# Patient Record
Sex: Female | Born: 1955 | Race: Black or African American | Hispanic: No | Smoking: Never smoker
Health system: Southern US, Community
[De-identification: ages and names within clinical notes are randomized; demographics above are authoritative.]

## PROBLEM LIST (undated history)

## (undated) DIAGNOSIS — J301 Allergic rhinitis due to pollen: Secondary | ICD-10-CM

## (undated) DIAGNOSIS — I10 Essential (primary) hypertension: Secondary | ICD-10-CM

## (undated) HISTORY — DX: Essential (primary) hypertension: I10

---

## 1998-10-12 ENCOUNTER — Other Ambulatory Visit: Admission: RE | Admit: 1998-10-12 | Discharge: 1998-10-12 | Payer: Self-pay | Admitting: Family Medicine

## 1999-05-24 HISTORY — PX: ROTATOR CUFF REPAIR: SHX139

## 2000-06-20 ENCOUNTER — Ambulatory Visit (HOSPITAL_COMMUNITY): Admission: RE | Admit: 2000-06-20 | Discharge: 2000-06-20 | Payer: Self-pay | Admitting: Family Medicine

## 2000-06-20 ENCOUNTER — Encounter: Payer: Self-pay | Admitting: Family Medicine

## 2000-09-01 ENCOUNTER — Other Ambulatory Visit: Admission: RE | Admit: 2000-09-01 | Discharge: 2000-09-01 | Payer: Self-pay | Admitting: Orthopedic Surgery

## 2001-09-12 ENCOUNTER — Ambulatory Visit (HOSPITAL_COMMUNITY): Admission: RE | Admit: 2001-09-12 | Discharge: 2001-09-12 | Payer: Self-pay | Admitting: Orthopedic Surgery

## 2001-09-12 ENCOUNTER — Encounter: Payer: Self-pay | Admitting: Orthopedic Surgery

## 2001-10-23 ENCOUNTER — Other Ambulatory Visit: Admission: RE | Admit: 2001-10-23 | Discharge: 2001-10-23 | Payer: Self-pay | Admitting: Family Medicine

## 2004-03-15 ENCOUNTER — Other Ambulatory Visit: Admission: RE | Admit: 2004-03-15 | Discharge: 2004-03-15 | Payer: Self-pay | Admitting: Family Medicine

## 2006-05-31 ENCOUNTER — Ambulatory Visit (HOSPITAL_COMMUNITY): Admission: RE | Admit: 2006-05-31 | Discharge: 2006-05-31 | Payer: Self-pay | Admitting: Family Medicine

## 2006-06-19 ENCOUNTER — Other Ambulatory Visit: Admission: RE | Admit: 2006-06-19 | Discharge: 2006-06-19 | Payer: Self-pay | Admitting: Family Medicine

## 2007-10-24 ENCOUNTER — Ambulatory Visit (HOSPITAL_COMMUNITY): Admission: RE | Admit: 2007-10-24 | Discharge: 2007-10-24 | Payer: Self-pay | Admitting: Family Medicine

## 2007-11-02 ENCOUNTER — Encounter: Admission: RE | Admit: 2007-11-02 | Discharge: 2007-11-02 | Payer: Self-pay | Admitting: Family Medicine

## 2007-11-22 ENCOUNTER — Other Ambulatory Visit: Admission: RE | Admit: 2007-11-22 | Discharge: 2007-11-22 | Payer: Self-pay | Admitting: Family Medicine

## 2010-01-18 ENCOUNTER — Encounter: Admission: RE | Admit: 2010-01-18 | Discharge: 2010-01-18 | Payer: Self-pay | Admitting: Family Medicine

## 2010-06-13 ENCOUNTER — Encounter: Payer: Self-pay | Admitting: Family Medicine

## 2012-10-29 ENCOUNTER — Encounter (HOSPITAL_COMMUNITY): Payer: Self-pay | Admitting: *Deleted

## 2012-10-29 ENCOUNTER — Emergency Department (HOSPITAL_COMMUNITY)
Admission: EM | Admit: 2012-10-29 | Discharge: 2012-10-29 | Disposition: A | Discharge status: Home / Self Care | Payer: BC Managed Care – PPO | Source: Home / Self Care | Attending: Emergency Medicine | Admitting: Emergency Medicine

## 2012-10-29 DIAGNOSIS — J029 Acute pharyngitis, unspecified: Secondary | ICD-10-CM

## 2012-10-29 DIAGNOSIS — R05 Cough: Secondary | ICD-10-CM

## 2012-10-29 DIAGNOSIS — R059 Cough, unspecified: Secondary | ICD-10-CM

## 2012-10-29 HISTORY — DX: Allergic rhinitis due to pollen: J30.1

## 2012-10-29 MED ORDER — PSEUDOEPH-BROMPHEN-DM 30-2-10 MG/5ML PO SYRP: 5.0000 mL | ORAL_SOLUTION | Freq: Four times a day (QID) | ORAL | Status: DC | PRN

## 2012-10-29 NOTE — ED Notes (Signed)
C/o sore throat onset Friday that has gotten progressively. Hx. Of seasonal allergies.  Voice sounds hoarse. Has occ. cough and mucous is clear from her throat. C/o stuffy nose.  No chills or fever.

## 2012-10-29 NOTE — ED Provider Notes (Signed)
Medical screening examination/treatment/procedure(s) were performed by non-physician practitioner and as supervising physician I was immediately available for consultation/collaboration.  Tanda Morrissey   Abdirizak Richison, MD 10/29/12 1949 

## 2012-10-29 NOTE — ED Provider Notes (Signed)
History     CSN: [id number]  Arrival date & time 10/29/12  1722   First MD Initiated Contact with Patient 10/29/12 1852      Chief Complaint  Patient presents with  . Sore Throat    (Consider location/radiation/quality/duration/timing/severity/associated sxs/prior treatment) HPI Comments: Pt presents c/o sore throat for 3 days, upset stomach, and feeling generally unwell.  She states this may be her allergies but she has had allergies for years and it never makes her throat hurt like this.  She has also had a mild, dry, non-productive cough.  She denies any measured fever, chills, vomiting, diarrhea, rash, sick contacts.    Patient is a 57 y.o. female presenting with pharyngitis.  Sore Throat Pertinent negatives include no chest pain, no abdominal pain and no shortness of breath.    Past Medical History  Diagnosis Date  . Hayfever     Past Surgical History  Procedure Laterality Date  . Rotator cuff repair Left 2001    Family History  Problem Relation Age of Onset  . Hypertension Mother   . Thyroid disease Mother   . Hypertension Father     History  Substance Use Topics  . Smoking status: Never Smoker   . Smokeless tobacco: Not on file  . Alcohol Use: No    OB History   Grav Para Term Preterm Abortions TAB SAB Ect Mult Living                  Review of Systems  Constitutional: Negative for fever and chills.  HENT: Positive for sore throat. Negative for congestion, rhinorrhea, sneezing, mouth sores, trouble swallowing, voice change, postnasal drip and sinus pressure.   Eyes: Negative for visual disturbance.  Respiratory: Positive for cough. Negative for shortness of breath.   Cardiovascular: Negative for chest pain, palpitations and leg swelling.  Gastrointestinal: Negative for nausea, vomiting and abdominal pain.  Endocrine: Negative for polydipsia and polyuria.  Genitourinary: Negative for dysuria, urgency and frequency.  Musculoskeletal: Negative for  myalgias and arthralgias.  Skin: Negative for rash.  Neurological: Negative for dizziness, weakness and light-headedness.    Allergies  Review of patient's allergies indicates not on file.  Home Medications   Current Outpatient Rx  Name  Route  Sig  Dispense  Refill  . cetirizine (ZYRTEC) 10 MG tablet   Oral   Take 10 mg by mouth daily.         . brompheniramine-pseudoephedrine-DM 30-2-10 MG/5ML syrup   Oral   Take 5 mLs by mouth 4 (four) times daily as needed.   120 mL   1     BP 140/87  Pulse 76  Temp(Src) 98.1 F (36.7 C) (Oral)  Resp 16  SpO2 100%  Physical Exam  Nursing note and vitals reviewed. Constitutional: She is oriented to person, place, and time. Vital signs are normal. She appears well-developed and well-nourished. No distress.  HENT:  Head: Atraumatic.  Mouth/Throat: Posterior oropharyngeal erythema present. No oropharyngeal exudate.  Eyes: EOM are normal. Pupils are equal, round, and reactive to light.  Cardiovascular: Normal rate, regular rhythm and normal heart sounds.  Exam reveals no gallop and no friction rub.   No murmur heard. Pulmonary/Chest: Effort normal and breath sounds normal. No respiratory distress. She has no wheezes. She has no rales.  Abdominal: Soft. There is no tenderness.  Lymphadenopathy:       Head (right side): Submandibular and tonsillar adenopathy present.       Head (left side): Submandibular and  tonsillar adenopathy present.    She has no cervical adenopathy.  Neurological: She is alert and oriented to person, place, and time. She has normal strength.  Skin: Skin is warm and dry. She is not diaphoretic.  Psychiatric: She has a normal mood and affect. Her behavior is normal. Judgment normal.    ED Course  Procedures (including critical care time)  Labs Reviewed  POCT RAPID STREP A ([facility] ONLY)   No results found.   1. Viral pharyngitis   2. Cough       MDM  Treat symptomatically.  F/u in 1 week if  not improving    Meds ordered this encounter  Medications  .       . brompheniramine-pseudoephedrine-DM 30-2-10 MG/5ML syrup    Sig: Take 5 mLs by mouth 4 (four) times daily as needed.    Dispense:  120 mL    Refill:  1           Graylon Good, PA-C 10/29/12 1925

## 2012-10-31 LAB — CULTURE, GROUP A STREP

## 2013-08-16 ENCOUNTER — Other Ambulatory Visit (HOSPITAL_COMMUNITY)
Admission: RE | Admit: 2013-08-16 | Discharge: 2013-08-16 | Disposition: A | Discharge status: Home / Self Care | Payer: BC Managed Care – PPO | Source: Ambulatory Visit | Attending: Family Medicine | Admitting: Family Medicine

## 2013-08-16 DIAGNOSIS — Z113 Encounter for screening for infections with a predominantly sexual mode of transmission: Secondary | ICD-10-CM | POA: Insufficient documentation

## 2013-08-16 DIAGNOSIS — N760 Acute vaginitis: Secondary | ICD-10-CM | POA: Insufficient documentation

## 2013-11-14 ENCOUNTER — Other Ambulatory Visit (HOSPITAL_COMMUNITY)
Admission: RE | Admit: 2013-11-14 | Discharge: 2013-11-14 | Disposition: A | Discharge status: Home / Self Care | Payer: BC Managed Care – PPO | Source: Ambulatory Visit | Attending: Family Medicine | Admitting: Family Medicine

## 2013-11-14 DIAGNOSIS — Z1151 Encounter for screening for human papillomavirus (HPV): Secondary | ICD-10-CM | POA: Insufficient documentation

## 2013-11-14 DIAGNOSIS — Z01419 Encounter for gynecological examination (general) (routine) without abnormal findings: Secondary | ICD-10-CM | POA: Insufficient documentation

## 2014-07-30 ENCOUNTER — Encounter (HOSPITAL_COMMUNITY): Payer: Self-pay | Admitting: Emergency Medicine

## 2014-07-30 ENCOUNTER — Emergency Department (HOSPITAL_COMMUNITY)
Admission: EM | Admit: 2014-07-30 | Discharge: 2014-07-30 | Disposition: A | Discharge status: Home / Self Care | Payer: BC Managed Care – PPO | Source: Home / Self Care | Attending: Family Medicine | Admitting: Family Medicine

## 2014-07-30 ENCOUNTER — Emergency Department (INDEPENDENT_AMBULATORY_CARE_PROVIDER_SITE_OTHER): Payer: BC Managed Care – PPO

## 2014-07-30 DIAGNOSIS — M25552 Pain in left hip: Secondary | ICD-10-CM

## 2014-07-30 DIAGNOSIS — R52 Pain, unspecified: Secondary | ICD-10-CM

## 2014-07-30 MED ORDER — HYDROCODONE-ACETAMINOPHEN 5-325 MG PO TABS: 2.0000 | ORAL_TABLET | ORAL | Status: DC | PRN

## 2014-07-30 MED ORDER — IBUPROFEN 800 MG PO TABS: 800.0000 mg | ORAL_TABLET | Freq: Three times a day (TID) | ORAL | Status: DC

## 2014-07-30 NOTE — ED Provider Notes (Signed)
CSN: [id number]     Arrival date & time 07/30/14  1421 History   First MD Initiated Contact with Patient 07/30/14 1541     Chief Complaint  Patient presents with  . Leg Pain   (Consider location/radiation/quality/duration/timing/severity/associated sxs/prior Treatment) Patient is a 59 y.o. female presenting with leg pain. The history is provided by the patient. No language interpreter was used.  Leg Pain Location:  Hip Injury: no   Hip location:  L hip Pain details:    Quality:  Aching   Radiates to:  Does not radiate   Severity:  Moderate   Onset quality:  Gradual   Timing:  Constant   Progression:  Worsening Chronicity:  New Dislocation: no   Foreign body present:  No foreign bodies Relieved by:  Nothing Worsened by:  Nothing tried Ineffective treatments:  Acetaminophen Risk factors: no recent illness   Pt had pain in both thighs and trouble walking 3 weeks ago.  Pt reports pain in left groin and hip area now.  Pt reports she was recently treated with crestor for elevated cholesterol.  Past Medical History  Diagnosis Date  . Hayfever    Past Surgical History  Procedure Laterality Date  . Rotator cuff repair Left 2001   Family History  Problem Relation Age of Onset  . Hypertension Mother   . Thyroid disease Mother   . Hypertension Father    History  Substance Use Topics  . Smoking status: Never Smoker   . Smokeless tobacco: Not on file  . Alcohol Use: No   OB History    No data available     Review of Systems  Musculoskeletal: Positive for myalgias and joint swelling.  All other systems reviewed and are negative.   Allergies  Review of patient's allergies indicates no known allergies.  Home Medications   Prior to Admission medications   Medication Sig Start Date End Date Taking? Authorizing Provider  brompheniramine-pseudoephedrine-DM 30-2-10 MG/5ML syrup Take 5 mLs by mouth 4 (four) times daily as needed. 10/29/12   Graylon GoodZachary H Baker, PA-C  cetirizine  (ZYRTEC) 10 MG tablet Take 10 mg by mouth daily.    Historical Provider, MD   BP 126/78 mmHg  Pulse 67  Temp(Src) 97.8 F (36.6 C) (Oral)  Resp 16  SpO2 98% Physical Exam  Constitutional: She is oriented to person, place, and time. She appears well-developed and well-nourished.  Musculoskeletal: She exhibits tenderness.  Tender left hip and groin  Pain with figure 4.   Neurological: She is alert and oriented to person, place, and time. She has normal reflexes.  Skin: Skin is warm.  Psychiatric: She has a normal mood and affect.  Nursing note and vitals reviewed.   ED Course  Procedures (including critical care time) Labs Review Labs Reviewed - No data to display  Imaging Review Dg Hip Unilat With Pelvis 2-3 Views Left  07/30/2014   CLINICAL DATA:  Left hip pain, 2 weeks duration  EXAM: LEFT HIP (WITH PELVIS) 2-3 VIEWS  COMPARISON:  None.  FINDINGS: There is mild joint space narrowing laterally. No fracture. No avascular necrosis. No other focal finding. Bones of the pelvis appear normal. Sacroiliac joints are normal.  IMPRESSION: Mild joint space narrowing laterally.  Otherwise normal.   Electronically Signed   By: Paulina FusiMark  Shogry M.D.   On: 07/30/2014 16:20     MDM   1. Pain    See your Physicain for recheck next week.   Stop crestor Ibuprofen 600mg .  Hydrocodone  Lonia Skinner [place], PA-C 07/30/14 1747

## 2014-07-30 NOTE — Discharge Instructions (Signed)

## 2014-07-30 NOTE — ED Notes (Signed)
Previous   Entry  By  This  Clinical research associateWriter  Entered     In  error

## 2014-07-30 NOTE — ED Notes (Signed)
C/o intermittent left leg pain onset 1-2 weeks Today, pain has been more constant; 6/10 Denies inj/trauma Alert, no signs of acute distress.

## 2016-03-15 ENCOUNTER — Other Ambulatory Visit: Payer: Self-pay | Admitting: Family Medicine

## 2016-03-15 ENCOUNTER — Other Ambulatory Visit (HOSPITAL_COMMUNITY)
Admission: RE | Admit: 2016-03-15 | Discharge: 2016-03-15 | Disposition: A | Discharge status: Home / Self Care | Payer: BC Managed Care – PPO | Source: Ambulatory Visit | Attending: Family Medicine | Admitting: Family Medicine

## 2016-03-15 DIAGNOSIS — Z113 Encounter for screening for infections with a predominantly sexual mode of transmission: Secondary | ICD-10-CM | POA: Insufficient documentation

## 2016-03-15 DIAGNOSIS — Z1151 Encounter for screening for human papillomavirus (HPV): Secondary | ICD-10-CM | POA: Insufficient documentation

## 2016-03-15 DIAGNOSIS — Z01419 Encounter for gynecological examination (general) (routine) without abnormal findings: Secondary | ICD-10-CM | POA: Diagnosis present

## 2016-03-18 LAB — CYTOLOGY - PAP
DIAGNOSIS: NEGATIVE
HPV (WINDOPATH): NOT DETECTED

## 2016-03-21 LAB — CERVICOVAGINAL ANCILLARY ONLY
BACTERIAL VAGINITIS: NEGATIVE
CANDIDA VAGINITIS: NEGATIVE
CHLAMYDIA, DNA PROBE: NEGATIVE
NEISSERIA GONORRHEA: NEGATIVE
TRICH (WINDOWPATH): NEGATIVE

## 2017-12-21 ENCOUNTER — Ambulatory Visit
Admission: RE | Admit: 2017-12-21 | Discharge: 2017-12-21 | Disposition: A | Discharge status: Home / Self Care | Payer: BC Managed Care – PPO | Source: Ambulatory Visit | Attending: Family Medicine | Admitting: Family Medicine

## 2017-12-21 ENCOUNTER — Other Ambulatory Visit: Payer: Self-pay | Admitting: Family Medicine

## 2017-12-21 DIAGNOSIS — M130 Polyarthritis, unspecified: Secondary | ICD-10-CM

## 2017-12-21 DIAGNOSIS — M2011 Hallux valgus (acquired), right foot: Secondary | ICD-10-CM

## 2017-12-21 DIAGNOSIS — M2012 Hallux valgus (acquired), left foot: Secondary | ICD-10-CM

## 2018-09-03 ENCOUNTER — Ambulatory Visit
Admission: RE | Admit: 2018-09-03 | Discharge: 2018-09-03 | Disposition: A | Discharge status: Home / Self Care | Payer: BC Managed Care – PPO | Source: Ambulatory Visit | Attending: Family Medicine | Admitting: Family Medicine

## 2018-09-03 ENCOUNTER — Other Ambulatory Visit: Payer: Self-pay

## 2018-09-03 ENCOUNTER — Other Ambulatory Visit: Payer: Self-pay | Admitting: Family Medicine

## 2018-09-03 DIAGNOSIS — J320 Chronic maxillary sinusitis: Secondary | ICD-10-CM

## 2018-09-03 DIAGNOSIS — J301 Allergic rhinitis due to pollen: Secondary | ICD-10-CM

## 2018-09-03 DIAGNOSIS — G4489 Other headache syndrome: Secondary | ICD-10-CM

## 2018-10-01 ENCOUNTER — Other Ambulatory Visit: Payer: Self-pay

## 2018-10-01 ENCOUNTER — Ambulatory Visit
Admission: RE | Admit: 2018-10-01 | Discharge: 2018-10-01 | Disposition: A | Discharge status: Home / Self Care | Payer: BC Managed Care – PPO | Source: Ambulatory Visit | Attending: Family Medicine | Admitting: Family Medicine

## 2019-07-31 ENCOUNTER — Other Ambulatory Visit: Payer: Self-pay | Admitting: *Deleted

## 2019-07-31 ENCOUNTER — Telehealth: Payer: Self-pay | Admitting: *Deleted

## 2019-07-31 DIAGNOSIS — R000 Tachycardia, unspecified: Secondary | ICD-10-CM

## 2019-07-31 NOTE — Telephone Encounter (Signed)
Patient enrolled for [employer] to ship a 30 day cardiac event monitor to her home. 

## 2019-08-08 ENCOUNTER — Ambulatory Visit (INDEPENDENT_AMBULATORY_CARE_PROVIDER_SITE_OTHER): Payer: BC Managed Care – PPO

## 2019-08-08 DIAGNOSIS — R000 Tachycardia, unspecified: Secondary | ICD-10-CM

## 2019-08-31 ENCOUNTER — Ambulatory Visit: Payer: BC Managed Care – PPO | Attending: Internal Medicine

## 2019-08-31 DIAGNOSIS — Z23 Encounter for immunization: Secondary | ICD-10-CM

## 2019-08-31 NOTE — Progress Notes (Signed)
   Covid-19 Vaccination Clinic  Name:  Rebecca Livingston    MRN: [id number] DOB: 14-Aug-1955  08/31/2019  Ms. Rebecca Livingston was observed post Covid-19 immunization for 15 minutes without incident. She was provided with Vaccine Information Sheet and instruction to access the V-Safe system.   Ms. Rebecca Livingston was instructed to call 911 with any severe reactions post vaccine: Marland Kitchen Difficulty breathing  . Swelling of face and throat  . A fast heartbeat  . A bad rash all over body  . Dizziness and weakness   Immunizations Administered    Name Date Dose VIS Date Route   Moderna COVID-19 Vaccine 08/31/2019 10:22 AM 0.5 mL 04/23/2019 Intramuscular   Manufacturer: Moderna   Lot: [id number]   NDC: 47096-283-66

## 2019-09-20 NOTE — Progress Notes (Signed)
Primary Physician/Referring:  Lucianne Lei, MD  Patient ID: Rebecca Livingston, female    DOB: August 29, 1955, 64 y.o.   MRN: [id number]  Chief Complaint  Patient presents with  . Palpitations   HPI:    Rebecca Livingston  is a 64 y.o. African-American female who I had initially seen in 2016 for episodes of rapid heartbeat suggestive of SVT, event monitor and stress testing at that time and echocardiogram are fairly within normal limits.  She has past medical history significant for hyperlipidemia and uses metoprolol on a as needed basis.   She has SVT episodes (symptoms very typical ans suggests SVT) rarely. Last episode was in July 2019, May 2019 and latest 07/24/19. She wore an event monitor for about a month and she did not have any episodes during wearing the monitor.  She now wanted to check in with me for further management.  She remains asymptomatic. Episodes last < 10 minutes. Then she feels fatigued for several hours.   Past Medical History:  Diagnosis Date  . Hayfever    Past Surgical History:  Procedure Laterality Date  . ROTATOR CUFF REPAIR Left 2001   Family History  Problem Relation Age of Onset  . Hypertension Mother   . Thyroid disease Mother   . Hypertension Father   . Asthma Brother   . Hypertension Brother   . Hypertension Sister     Social History   Tobacco Use  . Smoking status: Never Smoker  . Smokeless tobacco: Never Used  Substance Use Topics  . Alcohol use: No   Marital Status: Divorced  ROS  Review of Systems  Cardiovascular: Negative for chest pain, dyspnea on exertion and leg swelling.  Gastrointestinal: Negative for melena.   Objective  Blood pressure 129/86, pulse 78, resp. rate 16, height '5\' 6"'  (1.676 m), weight 169 lb 3.2 oz (76.7 kg), SpO2 98 %.  Vitals with BMI 09/23/2019 07/30/2014 10/29/2012  Height '5\' 6"'  - -  Weight 169 lbs 3 oz - -  BMI 93.26 - -  Systolic [phone]  Diastolic 86 78 87  Pulse 78 67 76     Physical Exam    Cardiovascular: Normal rate, regular rhythm, normal heart sounds and intact distal pulses. Exam reveals no gallop.  No murmur heard. No leg edema, no JVD.  Pulmonary/Chest: Effort normal and breath sounds normal.  Abdominal: Soft. Bowel sounds are normal.   Laboratory examination:   No results for input(s): NA, K, CL, CO2, GLUCOSE, BUN, CREATININE, CALCIUM, GFRNONAA, GFRAA in the last 8760 hours. CrCl cannot be calculated (No successful lab value found.).  No flowsheet data found. No flowsheet data found. Lipid Panel  No results found for: CHOL, TRIG, HDL, CHOLHDL, VLDL, LDLCALC, LDLDIRECT HEMOGLOBIN A1C No results found for: HGBA1C, MPG TSH No results for input(s): TSH in the last 8760 hours.  External labs:   Cholesterol, total 211.000 m 07/26/2019 HDL 71.000 mg 07/26/2019 LDL 115.000 m 07/26/2019 Triglycerides 135.000 m 07/26/2019  A1C 5.900 % of 07/31/2018 TSH 0.280 07/26/2019  Hemoglobin 12.900 g/ 07/26/2019 PLT 272.000  07/26/2019  Creatinine, Serum N/D Potassium 4.100 mm 07/26/2019 Magnesium N/D ALT (SGPT) 11.000 U/L 07/26/2019   Medications and allergies  No Known Allergies   Current Outpatient Medications  Medication Instructions  . cetirizine (ZYRTEC) 10 mg, Daily  . rosuvastatin (CRESTOR) 20 mg, Oral, Daily   Radiology:   No results found.  Cardiac Studies:   Event Monitor 30 days Aug 08, 2016: 09/12/2019 for 30 days:  No symptoms  reported. Sinus rhythm No atrial fibrillation events No AV block or pauses Rare nonsustained ventricular tachycardia.(personally reviewed -  AT with aberancy 4 beats  No sustained arrhythmias Baseline artifact limits interpretation  Treadmill exercise stress test 08/27/2016: Indication: Palpitations, CP Resting EKG demonstrates NSR. The patient exercised according to Bruce Protocol, Total time recorded 7:29 min achieving max heart rate of 154 which was 96% of THR for age and 9.34 METS of work. Stress terminated due to fatigue &  THR (>85% MPHR)/MPHR met. Normal BP response. There was no ST-T changes of ischemia with exercise stress test. There were no significant arrhythmias. Normal BP response. Rec: No e/o ischemia by GXT. Exercise tolerence is low normal. Continue Preventive therapy.  Echocardiogram 08/10/2016: Left ventricle cavity is normal in size. Normal global wall motion. Normal diastolic filling pattern, normal LAP. Calculated EF 68%. Trace mitral regurgitation. Trace tricuspid regurgitation. No evidence of pulmonary hypertension. Trace pulmonic regurgitation. Essentially normal echocardiogram.  EKG  EKG 09/23/2019: Normal sinus rhythm with rate of 70 bpm, left atrial enlargement, normal axis.  No evidence of ischemia.  Assessment     ICD-10-CM   1. PSVT (paroxysmal supraventricular tachycardia) (HCC)  I47.1 EKG 12-Lead    No orders of the defined types were placed in this encounter.   Medications Discontinued During This Encounter  Medication Reason  . brompheniramine-pseudoephedrine-DM 30-2-10 MG/5ML syrup No longer needed (for PRN medications)  . HYDROcodone-acetaminophen (NORCO/VICODIN) 5-325 MG per tablet Patient Preference  . ibuprofen (ADVIL,MOTRIN) 800 MG tablet Patient Preference    Recommendations:   Rebecca Livingston  is a 64 y.o. African-American female who I had initially seen in 2016 for episodes of rapid heartbeat suggestive of SVT, event monitor and stress testing at that time and echocardiogram are fairly within normal limits.  She has past medical history significant for hyperlipidemia.  She has SVT episodes (symptoms very typical ans suggests SVT) rarely. Last episode was in July 2019, May 2019 and latest 07/24/19.  Episodes last < 10 minutes. Then she feels fatigued for several hours.   As her symptoms are extremely rare and only last a few minutes, I have reassured her, and that the symptoms become frequent or sustained would not recommend any further evaluation. Valsalva  strain: I have explained to the patient the mechanism of PSVT, explained Valsalva maneuver, straining, splashing cold water on the face.    Patient also advised to go to the nearest medical facility to obtain a EKG to confirm presence of PSVT or activate EMS.  Also discussed if the usual maneuvers do not improve the symptoms to go to the emergency department.  I will see her back in 1 year for follow-up.  I personally reviewed her event monitor that she wore it last month, no significant arrhythmias were noted.  1 episode of 4 beat NSVT was really an ectopic atrial rhythm with aberrancy.  Lipids are being managed by PCP and controlled. Reviewed her external labs.   Adrian Prows, MD, [facility] 09/23/2019, 10:47 AM [facility]. [place] Pager: [phone] Office: [phone]

## 2019-09-23 ENCOUNTER — Encounter: Payer: Self-pay | Admitting: Cardiology

## 2019-09-23 ENCOUNTER — Ambulatory Visit: Payer: BC Managed Care – PPO | Admitting: Cardiology

## 2019-09-23 ENCOUNTER — Other Ambulatory Visit: Payer: Self-pay

## 2019-09-23 VITALS — BP 120/70 | HR 78 | Resp 16 | Ht 66.0 in | Wt 169.2 lb

## 2019-09-23 DIAGNOSIS — E7800 Pure hypercholesterolemia, unspecified: Secondary | ICD-10-CM

## 2019-09-23 DIAGNOSIS — I471 Supraventricular tachycardia: Secondary | ICD-10-CM

## 2019-09-28 ENCOUNTER — Ambulatory Visit: Payer: BC Managed Care – PPO | Attending: Internal Medicine

## 2019-09-28 DIAGNOSIS — Z23 Encounter for immunization: Secondary | ICD-10-CM

## 2019-09-28 NOTE — Progress Notes (Signed)
   Covid-19 Vaccination Clinic  Name:  Rebecca Livingston    MRN: [id number] DOB: Feb 24, 1956  09/28/2019  Rebecca Livingston was observed post Covid-19 immunization for 15 minutes without incident. She was provided with Vaccine Information Sheet and instruction to access the V-Safe system.   Rebecca Livingston was instructed to call 911 with any severe reactions post vaccine: Marland Kitchen Difficulty breathing  . Swelling of face and throat  . A fast heartbeat  . A bad rash all over body  . Dizziness and weakness   Immunizations Administered    Name Date Dose VIS Date Route   Moderna COVID-19 Vaccine 09/28/2019 10:02 AM 0.5 mL 04/2019 Intramuscular   Manufacturer: Moderna   Lot: [id number]   NDC: 36629-476-54

## 2020-04-15 ENCOUNTER — Other Ambulatory Visit: Payer: Self-pay

## 2020-04-15 ENCOUNTER — Emergency Department (HOSPITAL_COMMUNITY)
Admission: EM | Admit: 2020-04-15 | Discharge: 2020-04-15 | Disposition: A | Discharge status: Home / Self Care | Payer: BC Managed Care – PPO | Attending: Emergency Medicine | Admitting: Emergency Medicine

## 2020-04-15 DIAGNOSIS — Z7982 Long term (current) use of aspirin: Secondary | ICD-10-CM | POA: Diagnosis not present

## 2020-04-15 DIAGNOSIS — R002 Palpitations: Secondary | ICD-10-CM | POA: Diagnosis present

## 2020-04-15 DIAGNOSIS — I471 Supraventricular tachycardia: Secondary | ICD-10-CM

## 2020-04-15 LAB — CBC WITH DIFFERENTIAL/PLATELET
Abs Immature Granulocytes: 0.04 10*3/uL (ref 0.00–0.07)
Basophils Absolute: 0.1 10*3/uL (ref 0.0–0.1)
Basophils Relative: 1 %
Eosinophils Absolute: 0 10*3/uL (ref 0.0–0.5)
Eosinophils Relative: 0 %
HCT: 42.8 % (ref 36.0–46.0)
Hemoglobin: 13.3 g/dL (ref 12.0–15.0)
Immature Granulocytes: 0 %
Lymphocytes Relative: 19 %
Lymphs Abs: 1.7 10*3/uL (ref 0.7–4.0)
MCH: 28.3 pg (ref 26.0–34.0)
MCHC: 31.1 g/dL (ref 30.0–36.0)
MCV: 91.1 fL (ref 80.0–100.0)
Monocytes Absolute: 0.5 10*3/uL (ref 0.1–1.0)
Monocytes Relative: 6 %
Neutro Abs: 6.7 10*3/uL (ref 1.7–7.7)
Neutrophils Relative %: 74 %
Platelets: 281 10*3/uL (ref 150–400)
RBC: 4.7 MIL/uL (ref 3.87–5.11)
RDW: 13 % (ref 11.5–15.5)
WBC: 9.1 10*3/uL (ref 4.0–10.5)
nRBC: 0 % (ref 0.0–0.2)

## 2020-04-15 LAB — BASIC METABOLIC PANEL
Anion gap: 10 (ref 5–15)
BUN: 14 mg/dL (ref 8–23)
CO2: 23 mmol/L (ref 22–32)
Calcium: 8.8 mg/dL — ABNORMAL LOW (ref 8.9–10.3)
Chloride: 108 mmol/L (ref 98–111)
Creatinine, Ser: 0.81 mg/dL (ref 0.44–1.00)
GFR, Estimated: >60 mL/min (ref >60)
Glucose, Bld: 119 mg/dL — ABNORMAL HIGH (ref 70–99)
Potassium: 3.7 mmol/L (ref 3.5–5.1)
Sodium: 141 mmol/L (ref 135–145)

## 2020-04-15 LAB — MAGNESIUM: Magnesium: 2 mg/dL (ref 1.7–2.4)

## 2020-04-15 MED ORDER — METOPROLOL TARTRATE 25 MG PO TABS: 25.0000 mg | ORAL_TABLET | Freq: Every day | ORAL | 0 refills | Status: DC | PRN

## 2020-04-15 MED ORDER — ASPIRIN EC 81 MG PO TBEC: 81.0000 mg | DELAYED_RELEASE_TABLET | Freq: Every day | ORAL | 0 refills | Status: DC

## 2020-04-15 NOTE — ED Notes (Signed)
Pt resting in bed. NADN 

## 2020-04-15 NOTE — Discharge Instructions (Signed)
You're seen in the ER for palpitations. Your heart rate has been in normal range while in the ER.  It appears that you were in a rhythm called supraventricular tachycardia.  Take the medication prescribed if your heart rate is over 120.  Start taking aspirin daily.  We would like you to follow-up with the A. fib clinic to determine further diagnostic work-up and management.

## 2020-04-15 NOTE — ED Provider Notes (Addendum)
[facility] EMERGENCY DEPARTMENT Provider Note   CSN: [id number] Arrival date & time: 04/15/20  7078     History Chief Complaint  Patient presents with  . Palpitations    Rebecca Livingston is a 64 y.o. female.  HPI    86 of female comes in a chief complaint of palpitations. Patient reports that she has no history of A. fib, CAD, cardiac arrhythmia.  Over the last several years, occasionally she will have bouts of arrhythmia.  She is actually seen cardiology and has had Holter monitoring, although work-up has been negative.  Typically the symptoms come once or twice every 6 months.  Yesterday around 1 AM she noted that her heart rate was racing.  This morning she woke up and continued to feel the same way, therefore she called EMS.  Patient has associated symptom of weakness and exertional shortness of breath.  Typically the symptoms would last for few minutes.  In the ER patient is asymptomatic and her heart rate is in the 80s. She went to an urgent care and they advised that she come to the ER because her heart rate was over 120.  No medications were given.  Past Medical History:  Diagnosis Date  . Hayfever     There are no problems to display for this patient.   Past Surgical History:  Procedure Laterality Date  . ROTATOR CUFF REPAIR Left 2001     OB History   No obstetric history on file.     Family History  Problem Relation Age of Onset  . Hypertension Mother   . Thyroid disease Mother   . Hypertension Father   . Asthma Brother   . Hypertension Brother   . Hypertension Sister     Social History   Tobacco Use  . Smoking status: Never Smoker  . Smokeless tobacco: Never Used  Vaping Use  . Vaping Use: Never used  Substance Use Topics  . Alcohol use: No  . Drug use: No    Home Medications Prior to Admission medications   Medication Sig Start Date End Date Taking? Authorizing Provider  cetirizine (ZYRTEC) 10 MG tablet Take 10 mg  by mouth daily.   Yes [provider]  OVER THE COUNTER MEDICATION Take 2 tablets by mouth daily. MDR - Fitness tabs for women   Yes [provider]  rosuvastatin (CRESTOR) 20 MG tablet Take 20 mg by mouth daily. 06/26/19  Yes [provider]  aspirin EC 81 MG tablet Take 1 tablet (81 mg total) by mouth daily. Swallow whole. 04/15/20   Derwood Kaplan, MD  metoprolol tartrate (LOPRESSOR) 25 MG tablet Take 1 tablet (25 mg total) by mouth daily as needed (HR > 120. Do not take the medicine in any event if the SBP < 100). 04/15/20   Derwood Kaplan, MD    Allergies    Patient has no known allergies.  Review of Systems   Review of Systems  Constitutional: Positive for fatigue. Negative for activity change.  Respiratory: Negative for shortness of breath.   Cardiovascular: Negative for chest pain.  Gastrointestinal: Negative for nausea and vomiting.  Musculoskeletal: Negative for arthralgias.  Allergic/Immunologic: Negative for immunocompromised state.  Neurological: Positive for light-headedness.    Physical Exam Updated Vital Signs BP 108/80   Pulse 74   Temp 98.4 F (36.9 C)   Resp (!) 22   Ht 5\' 4"  (1.626 m)   Wt 71.7 kg   SpO2 99%   BMI 27.12 kg/m  Physical Exam Vitals and nursing note reviewed.  Constitutional:      Appearance: She is well-developed.  HENT:     Head: Normocephalic and atraumatic.  Cardiovascular:     Rate and Rhythm: Normal rate.  Pulmonary:     Effort: Pulmonary effort is normal.  Abdominal:     General: Bowel sounds are normal.  Musculoskeletal:     Cervical back: Normal range of motion and neck supple.  Skin:    General: Skin is warm and dry.  Neurological:     Mental Status: She is alert and oriented to person, place, and time.     ED Results / Procedures / Treatments   Labs (all labs ordered are listed, but only abnormal results are displayed) Labs Reviewed  BASIC METABOLIC PANEL - Abnormal; Notable for the  following components:      Result Value   Glucose, Bld 119 (*)    Calcium 8.8 (*)    All other components within normal limits  CBC WITH DIFFERENTIAL/PLATELET  MAGNESIUM    EKG EKG Interpretation  Date/Time:  Wednesday April 15 2020 09:34:50 EST Ventricular Rate:  84 PR Interval:    QRS Duration: 82 QT Interval:  370 QTC Calculation: 438 R Axis:   58 Text Interpretation: Sinus rhythm Atrial premature complex RAE, consider biatrial enlargement RSR' in V1 or V2, probably normal variant No acute changes No old tracing to compare Confirmed by Derwood Kaplan [phone]) on 04/15/2020 9:52:27 AM   Radiology No results found.  Procedures Procedures (including critical care time)      Medications Ordered in ED Medications - No data to display  ED Course  I have reviewed the triage vital signs and the nursing notes.  Pertinent labs & imaging results that were available during my care of the patient were reviewed by me and considered in my medical decision making (see chart for details).    MDM Rules/Calculators/A&P                          Pt comes in with cc of palpitation.  I have reviewed the EKGs from the urgent care and EMS.  Patient has a narrow complex tachycardia, regular rhythm, with heart rate close to 150.  Differential diagnosis is a flutter with 2-1 block, PSVT.  Patient currently in sinus rhythm.  She doesn't have risk factors for A. Fib.  Plan is to have her follow-up with A. fib clinic.  Her [place] vas score is 1.  We will start her on aspirin.  25 mg oral metoprolol will be initiated as a as needed medication for palpitations with heart rate over 120.   1:57 PM    CHA2DS2-VASc Score = 1  This indicates a 0.6% annual risk of stroke. The patient's score is based upon: CHF History: 0 HTN History: 0 Diabetes History: 0 Stroke History: 0 Vascular Disease History: 0 Age Score: 0 Gender Score: 1     1:57 PM The patient appears reasonably screened  and/or stabilized for discharge and I doubt any other medical condition or other [facility] requiring further screening, evaluation, or treatment in the ED at this time prior to discharge.   Results from the ER workup discussed with the patient face to face and all questions answered to the best of my ability. The patient is safe for discharge with strict return precautions.  No arrhythmia noted in the ER.  Final Clinical Impression(s) / ED Diagnoses Final diagnoses:  SVT (  supraventricular tachycardia) (HCC)    Rx / DC Orders ED Discharge Orders         Ordered    aspirin EC 81 MG tablet  Daily        04/15/20 1140    metoprolol tartrate (LOPRESSOR) 25 MG tablet  Daily PRN        04/15/20 1140            Derwood Kaplan, MD 04/15/20 1357

## 2020-04-15 NOTE — ED Triage Notes (Signed)
Pt presents from urgent care w/ c/o palpitation. Discovered to be in SVT @ 150. Enroute, pt received 6mg  Adenosine with successful conversion of rhythm to NSR. On arrival, Pt in NSR denies distress.

## 2020-05-21 ENCOUNTER — Ambulatory Visit: Payer: BC Managed Care – PPO

## 2020-09-23 ENCOUNTER — Ambulatory Visit: Payer: BC Managed Care – PPO | Admitting: Cardiology

## 2020-11-09 ENCOUNTER — Ambulatory Visit: Payer: BC Managed Care – PPO | Admitting: Cardiology

## 2020-12-07 ENCOUNTER — Other Ambulatory Visit: Payer: Self-pay

## 2020-12-07 DIAGNOSIS — R252 Cramp and spasm: Secondary | ICD-10-CM

## 2020-12-15 ENCOUNTER — Encounter: Payer: Self-pay | Admitting: Vascular Surgery

## 2020-12-15 ENCOUNTER — Ambulatory Visit (INDEPENDENT_AMBULATORY_CARE_PROVIDER_SITE_OTHER)
Admission: RE | Admit: 2020-12-15 | Discharge: 2020-12-15 | Disposition: A | Discharge status: Home / Self Care | Payer: BC Managed Care – PPO | Source: Ambulatory Visit | Attending: Vascular Surgery | Admitting: Vascular Surgery

## 2020-12-15 ENCOUNTER — Ambulatory Visit (HOSPITAL_COMMUNITY)
Admission: RE | Admit: 2020-12-15 | Discharge: 2020-12-15 | Disposition: A | Discharge status: Home / Self Care | Payer: BC Managed Care – PPO | Source: Ambulatory Visit | Attending: Vascular Surgery | Admitting: Vascular Surgery

## 2020-12-15 ENCOUNTER — Ambulatory Visit: Payer: BC Managed Care – PPO | Admitting: Vascular Surgery

## 2020-12-15 ENCOUNTER — Other Ambulatory Visit: Payer: Self-pay

## 2020-12-15 VITALS — BP 149/95 | HR 68 | Temp 97.8°F | Resp 20 | Ht 64.0 in | Wt 161.0 lb

## 2020-12-15 DIAGNOSIS — R252 Cramp and spasm: Secondary | ICD-10-CM | POA: Insufficient documentation

## 2020-12-15 NOTE — Progress Notes (Signed)
[facility] [place]  ASSESSMENT / PLAN: 65 y.o. female with bilateral leg cramping of unclear etiology.  Patient reports its somewhat related to dehydration.  I counseled her on supportive measures.  She has no evidence of peripheral arterial disease.  Mild venous reflux identified on today's scan is not the cause of her cramping.  Follow-up with me as needed.  CHIEF COMPLAINT: Leg cramping  HISTORY OF PRESENT ILLNESS: Rebecca Livingston is a 65 y.o. female who presents to clinic for evaluation of painful cramping.  This occurs in the bilateral legs episodically.  Cramping occurs in her thighs and progresses to her calves.  Walking around seems to improve the cramping.  She does not have symptoms classic for intermittent claudication.  She does not have symptoms classic for ischemic rest pain.  She has no ulcers about her feet.  She has no limits to ambulation.  Past Medical History:  Diagnosis Date   Hayfever    Hypertension     Past Surgical History:  Procedure Laterality Date   ROTATOR CUFF REPAIR Left 2001    Family History  Problem Relation Age of Onset   Hypertension Mother    Thyroid disease Mother    Hypertension Father    Asthma Brother    Hypertension Brother    Hypertension Sister     Social History   Socioeconomic History   Marital status: Divorced    Spouse name: Not on file   Number of children: 2   Years of education: Not on file   Highest education level: Not on file  Occupational History   Not on file  Tobacco Use   Smoking status: Never   Smokeless tobacco: Never  Vaping Use   Vaping Use: Never used  Substance and Sexual Activity   Alcohol use: No   Drug use: No   Sexual activity: Yes    Birth control/protection: None  Other Topics Concern   Not on file  Social History Narrative   Not on file   Social Determinants of Health   Financial Resource Strain: Not on file  Food Insecurity: Not on file  Transportation  Needs: Not on file  Physical Activity: Not on file  Stress: Not on file  Social Connections: Not on file  Intimate Partner Violence: Not on file    No Known Allergies  Current Outpatient Medications  Medication Sig Dispense Refill   aspirin EC 81 MG tablet Take 1 tablet (81 mg total) by mouth daily. Swallow whole. 30 tablet 0   cetirizine (ZYRTEC) 10 MG tablet Take 10 mg by mouth daily.     cholecalciferol (VITAMIN D3) 25 MCG (1000 UNIT) tablet Take 1,000 Units by mouth daily.     metoprolol tartrate (LOPRESSOR) 25 MG tablet Take 1 tablet (25 mg total) by mouth daily as needed (HR > 120. Do not take the medicine in any event if the SBP < 100). 15 tablet 0   Omega-3 Fatty Acids (FISH OIL) 1000 MG CAPS Take by mouth.     OVER THE COUNTER MEDICATION Take 2 tablets by mouth daily. MDR - Fitness tabs for women     vitamin C (ASCORBIC ACID) 500 MG tablet Take 500 mg by mouth daily.     rosuvastatin (CRESTOR) 20 MG tablet Take 20 mg by mouth daily. (Patient not taking: Reported on 12/15/2020)     No current facility-administered medications for this visit.    REVIEW OF SYSTEMS:  [X]  denotes positive finding, [ ]  denotes  negative finding Cardiac  Comments:  Chest pain or chest pressure:    Shortness of breath upon exertion:    Short of breath when lying flat:    Irregular heart rhythm:        Vascular    Pain in calf, thigh, or hip brought on by ambulation:    Pain in feet at night that wakes you up from your sleep:     Blood clot in your veins:    Leg swelling:         Pulmonary    Oxygen at home:    Productive cough:     Wheezing:         Neurologic    Sudden weakness in arms or legs:     Sudden numbness in arms or legs:     Sudden onset of difficulty speaking or slurred speech:    Temporary loss of vision in one eye:     Problems with dizziness:         Gastrointestinal    Blood in stool:     Vomited blood:         Genitourinary    Burning when urinating:     Blood in  urine:        Psychiatric    Major depression:         Hematologic    Bleeding problems:    Problems with blood clotting too easily:        Skin    Rashes or ulcers:        Constitutional    Fever or chills:      PHYSICAL EXAM Vitals:   12/15/20 1532  BP: (!) 149/95  Pulse: 68  Resp: 20  Temp: 97.8 F (36.6 C)  SpO2: 100%  Weight: 161 lb (73 kg)  Height: 5\' 4"  (1.626 m)    Constitutional: well appearing. no distress. Appears well nourished.  Neurologic: CN intact. no focal findings. no sensory loss. Psychiatric:  Mood and affect symmetric and appropriate. Eyes:  No icterus. No conjunctival pallor. Ears, nose, throat:  mucous membranes moist. Midline trachea.  Cardiac: regular rate and rhythm.  Respiratory:  unlabored. Abdominal:  soft, non-tender, non-distended.  Peripheral vascular: 2+ DP pulses bilaterally Extremity: no edema. no cyanosis. no pallor.  Skin: no gangrene. no ulceration.  Lymphatic: no Stemmer's sign. no palpable lymphadenopathy.  PERTINENT LABORATORY AND RADIOLOGIC DATA  Most recent CBC CBC Latest Ref Rng & Units 04/15/2020  WBC 4.0 - 10.5 K/uL 9.1  Hemoglobin 12.0 - 15.0 g/dL 04/17/2020  Hematocrit 67.6 - 46.0 % 42.8  Platelets 150 - 400 K/uL 281     Most recent CMP CMP Latest Ref Rng & Units 04/15/2020  Glucose 70 - 99 mg/dL 04/17/2020)  BUN 8 - 23 mg/dL 14  Creatinine 093(O - 6.71 mg/dL 2.45  Sodium 8.09 - 983 mmol/L 141  Potassium 3.5 - 5.1 mmol/L 3.7  Chloride 98 - 111 mmol/L 108  CO2 22 - 32 mmol/L 23  Calcium 8.9 - 10.3 mg/dL 382)   ABI 5.0(N:     Venous reflux study 12/15/20  12/17/20. Rande Brunt, MD [facility] Phone Number: [phone] 12/15/2020 4:35 PM

## 2020-12-16 ENCOUNTER — Ambulatory Visit: Payer: BC Managed Care – PPO | Admitting: Cardiology

## 2020-12-28 ENCOUNTER — Ambulatory Visit: Payer: BC Managed Care – PPO | Admitting: Cardiology

## 2021-01-12 ENCOUNTER — Ambulatory Visit: Payer: BC Managed Care – PPO | Admitting: Cardiology

## 2021-01-27 ENCOUNTER — Ambulatory Visit: Payer: BC Managed Care – PPO | Admitting: Cardiology

## 2021-03-01 ENCOUNTER — Encounter: Payer: Self-pay | Admitting: Cardiology

## 2021-03-01 ENCOUNTER — Other Ambulatory Visit: Payer: Self-pay

## 2021-03-01 ENCOUNTER — Ambulatory Visit: Payer: BC Managed Care – PPO | Admitting: Cardiology

## 2021-03-01 VITALS — BP 157/98 | HR 73 | Temp 97.9°F | Resp 17 | Ht 64.0 in | Wt 167.0 lb

## 2021-03-01 DIAGNOSIS — E7800 Pure hypercholesterolemia, unspecified: Secondary | ICD-10-CM

## 2021-03-01 DIAGNOSIS — R030 Elevated blood-pressure reading, without diagnosis of hypertension: Secondary | ICD-10-CM

## 2021-03-01 DIAGNOSIS — I471 Supraventricular tachycardia: Secondary | ICD-10-CM

## 2021-03-01 NOTE — Progress Notes (Signed)
Primary Physician/Referring:  Lucianne Lei, MD  Patient ID: Rebecca Livingston, female    DOB: [birth year], 65 y.o.   MRN: [id number]  Chief Complaint  Patient presents with   Follow-up    1 year   SVT   HPI:    Rebecca Livingston  is a 65 y.o. African-American female who I had initially seen in 2016 for episodes of rapid heartbeat suggestive of SVT, event monitor and stress testing at that time and echocardiogram are fairly within normal limits.  She has past medical history significant for hyperlipidemia.  She has SVT episodes (symptoms very typical ans suggests SVT) rarely.   Her last episode of sustained rapid heartbeat was in November 2021 and went to the urgent care, was sent to the ED but was back in sinus rhythm.  She has not had any further recurrence.  She is aware of Valsalva like maneuvers.  Episode lasted the entire night.  Past Medical History:  Diagnosis Date   Hayfever    Hypertension    Past Surgical History:  Procedure Laterality Date   ROTATOR CUFF REPAIR Left 2001   Family History  Problem Relation Age of Onset   Hypertension Mother    Thyroid disease Mother    Hypertension Father    Asthma Brother    Hypertension Brother    Hypertension Sister     Social History   Tobacco Use   Smoking status: Never   Smokeless tobacco: Never  Substance Use Topics   Alcohol use: No   Marital Status: Divorced  ROS  Review of Systems  Cardiovascular:  Negative for chest pain, dyspnea on exertion and leg swelling.  Gastrointestinal:  Negative for melena.  Objective  Blood pressure (!) 157/98, pulse 73, temperature 97.9 F (36.6 C), temperature source Temporal, resp. rate 17, height _0  (1.626 m), weight 167 lb (75.8 kg), SpO2 99 %.  Vitals with BMI 03/01/2021 03/01/2021 12/15/2020  Height - _1  _2   Weight - 167 lbs 161 lbs  BMI - 56.31 49.70  Systolic 263 785 885  Diastolic 98 83 95  Pulse 73 80 68     Physical Exam Cardiovascular:     Rate and  Rhythm: Normal rate and regular rhythm.     Pulses: Intact distal pulses.     Heart sounds: Normal heart sounds. No murmur heard.   No gallop.     Comments: No leg edema, no JVD. Pulmonary:     Effort: Pulmonary effort is normal.     Breath sounds: Normal breath sounds.  Abdominal:     General: Bowel sounds are normal.     Palpations: Abdomen is soft.   Laboratory examination:   Recent Labs    04/15/20 1026  NA 141  K 3.7  CL 108  CO2 23  GLUCOSE 119*  BUN 14  CREATININE 0.81  CALCIUM 8.8*  GFRNONAA >60   CrCl cannot be calculated (Patient's most recent lab result is older than the maximum 21 days allowed.).  CMP Latest Ref Rng & Units 04/15/2020  Glucose 70 - 99 mg/dL 119(H)  BUN 8 - 23 mg/dL 14  Creatinine 0.44 - 1.00 mg/dL 0.81  Sodium 135 - 145 mmol/L 141  Potassium 3.5 - 5.1 mmol/L 3.7  Chloride 98 - 111 mmol/L 108  CO2 22 - 32 mmol/L 23  Calcium 8.9 - 10.3 mg/dL 8.8(L)   CBC Latest Ref Rng & Units 04/15/2020  WBC 4.0 - 10.5 K/uL 9.1  Hemoglobin 12.0 - 15.0  g/dL 13.3  Hematocrit 36.0 - 46.0 % 42.8  Platelets 150 - 400 K/uL 281   Lipid Panel  No results found for: CHOL, TRIG, HDL, CHOLHDL, VLDL, LDLCALC, LDLDIRECT  External labs:   Cholesterol, total 302.000 m 02/25/2020 HDL 90.000 mg 02/25/2020 LDL 190.000 m 02/25/2020 Triglycerides 98.000 mg 02/25/2020  A1C 5.700 % of 10/27/2020  Cholesterol, total 211.000 m 07/26/2019 HDL 71.000 mg 07/26/2019 LDL 115.000 m 07/26/2019 Triglycerides 135.000 m 07/26/2019  A1C 5.900 % of 07/31/2018 TSH 0.280 07/26/2019  Hemoglobin 12.900 g/ 07/26/2019 PLT 272.000  07/26/2019  Creatinine, Serum N/D Potassium 4.100 mm 07/26/2019 Magnesium N/D ALT (SGPT) 11.000 U/L 07/26/2019   Medications and allergies  No Known Allergies   Current Outpatient Medications  Medication Instructions   aspirin EC 81 mg, Oral, Daily, Swallow whole.   cetirizine (ZYRTEC) 10 mg, Oral, Daily   cholecalciferol (VITAMIN D3) 1,000 Units, Oral, Daily    metoprolol tartrate (LOPRESSOR) 25 mg, Oral, Daily PRN   Omega-3 Fatty Acids (FISH OIL) 1000 MG CAPS Oral   OVER THE COUNTER MEDICATION 2 tablets, Oral, Daily, MDR - Fitness tabs for women   rosuvastatin (CRESTOR) 20 mg, Oral, Daily   vitamin C (ASCORBIC ACID) 500 mg, Oral, Daily   Radiology:   No results found.  Cardiac Studies:   Event Monitor 30 days 2016-08-31: 09/12/2019 for 30 days:  No symptoms reported. Sinus rhythm No atrial fibrillation events No AV block or pauses Rare nonsustained ventricular tachycardia.(personally reviewed -  AT with aberancy 4 beats  No sustained arrhythmias Baseline artifact limits interpretation  Treadmill exercise stress test 08/27/2016: Indication: Palpitations, CP Resting EKG demonstrates NSR. The patient exercised according to Bruce Protocol, Total time recorded  7:29 min achieving max heart rate of  154  which was  96% of THR for age and  9.34 METS of work.  Stress terminated due to  fatigue & THR (>85% MPHR)/MPHR met. Normal BP response. There was no ST-T changes of ischemia with exercise stress test. There were no significant arrhythmias.  Normal BP response. Rec: No e/o ischemia by GXT. Exercise tolerence is  low normal.  Continue Preventive therapy.   Echocardiogram 08/10/2016: Left ventricle cavity is normal in size. Normal global wall motion. Normal diastolic filling pattern, normal LAP. Calculated EF 68%. Trace mitral regurgitation. Trace tricuspid regurgitation. No evidence of pulmonary hypertension. Trace pulmonic regurgitation. Essentially normal echocardiogram.  EKG  EKG 03/01/2021: Normal sinus rhythm at rate of 84 bpm, normal axis, incomplete right bundle branch block.  Normal EKG.  No significant change from 09/23/2019.  Assessment     ICD-10-CM   1. PSVT (paroxysmal supraventricular tachycardia) (HCC)  I47.1 EKG 12-Lead    2. Pure hypercholesterolemia  E78.00 CT CARDIAC SCORING ([facility] LOCATIONS ONLY)    Lipid Panel With  LDL/HDL Ratio    LDL cholesterol, direct    3. Elevated blood pressure reading in office without diagnosis of hypertension  R03.0       No orders of the defined types were placed in this encounter.   There are no discontinued medications.   Recommendations:   Rebecca Livingston  is a 65 y.o. African-American female who I had initially seen in 2016 for episodes of rapid heartbeat suggestive of SVT, event monitor and stress testing at that time and echocardiogram are fairly within normal limits.  She has past medical history significant for hyperlipidemia.  She has SVT episodes (symptoms very typical ans suggests SVT) rarely.   Her last episode of sustained rapid heartbeat was  in November 2021 and went to the urgent care, was sent to the ED but was back in sinus rhythm.  She has not had any further recurrence.  She is aware of Valsalva like maneuvers.  Episode lasted the entire night.  Will continue observation for now.  Her blood pressure was markedly elevated today, there is no diagnosis of hypertension previously.  We will continue to trend this.  I reviewed her external labs, lipids are markedly elevated.  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  I have discussed with her regarding making changes to her lifestyle specifically her diet.  She was previously on a statin, but she discontinued this due to pain in her left thigh area however she still has pain even after discontinuing the statin hence she is willing to restart this back.  I will obtain a coronary calcium score for further cardiac risk stratification.  I will repeat her lipids in 2 months and I will like to see her back then.   Adrian Prows, MD, [facility] 03/01/2021, 3:19 PM Office: [phone] Fax: [phone] Pager: [phone]

## 2021-03-25 ENCOUNTER — Ambulatory Visit
Admission: RE | Admit: 2021-03-25 | Discharge: 2021-03-25 | Disposition: A | Discharge status: Home / Self Care | Payer: No Typology Code available for payment source | Source: Ambulatory Visit | Attending: Cardiology | Admitting: Cardiology

## 2021-03-25 DIAGNOSIS — E7800 Pure hypercholesterolemia, unspecified: Secondary | ICD-10-CM

## 2021-03-25 NOTE — Progress Notes (Signed)
Coronary calcium score 03/25/2021: LM 0 LAD 19 LCx 0 RCA 0. Total Alliston score is 19, MESA database percentile: 74. Ascending and descending thoracic aorta are normal in size. Indeterminate 3 mm nodule at the right lung base. No follow-up needed if patient is low-risk.

## 2021-07-06 ENCOUNTER — Other Ambulatory Visit: Payer: Self-pay | Admitting: Physician Assistant

## 2021-07-06 DIAGNOSIS — Z13820 Encounter for screening for osteoporosis: Secondary | ICD-10-CM

## 2022-02-16 ENCOUNTER — Ambulatory Visit
Admission: RE | Admit: 2022-02-16 | Discharge: 2022-02-16 | Disposition: A | Discharge status: Home / Self Care | Payer: BC Managed Care – PPO | Source: Ambulatory Visit | Attending: Physician Assistant | Admitting: Physician Assistant

## 2022-02-16 DIAGNOSIS — Z13820 Encounter for screening for osteoporosis: Secondary | ICD-10-CM

## 2022-03-07 ENCOUNTER — Other Ambulatory Visit: Payer: Self-pay | Admitting: Cardiology

## 2022-03-08 LAB — LIPID PANEL WITH LDL/HDL RATIO
Cholesterol, Total: 259 mg/dL — ABNORMAL HIGH (ref 100–199)
HDL: 80 mg/dL (ref >39)
LDL Chol Calc (NIH): 163 mg/dL — ABNORMAL HIGH (ref 0–99)
LDL/HDL Ratio: 2 ratio (ref 0.0–3.2)
Triglycerides: 95 mg/dL (ref 0–149)
VLDL Cholesterol Cal: 16 mg/dL (ref 5–40)

## 2022-03-08 LAB — LDL CHOLESTEROL, DIRECT: LDL Direct: 177 mg/dL — ABNORMAL HIGH (ref 0–99)

## 2022-03-11 ENCOUNTER — Ambulatory Visit: Payer: BC Managed Care – PPO

## 2022-03-11 VITALS — BP 143/88 | HR 84 | Temp 98.0°F | Resp 16 | Ht 64.0 in | Wt 164.0 lb

## 2022-03-11 DIAGNOSIS — R030 Elevated blood-pressure reading, without diagnosis of hypertension: Secondary | ICD-10-CM

## 2022-03-11 DIAGNOSIS — E7800 Pure hypercholesterolemia, unspecified: Secondary | ICD-10-CM

## 2022-03-11 DIAGNOSIS — I471 Supraventricular tachycardia, unspecified: Secondary | ICD-10-CM

## 2022-03-11 NOTE — Progress Notes (Signed)
Primary Physician/Referring:  Lucianne Lei, MD  Patient ID: Rebecca Livingston, female    DOB: 1955/10/30, 66 y.o.   MRN: [id number]  Chief Complaint  Patient presents with   Psvt   Hyperlipidemia   Follow-up    1 year   HPI:    Rebecca Livingston  is a 66 y.o. African-American female who was initially seen in 2016 for episodes of rapid heartbeat suggestive of SVT, event monitor and stress testing at that time and echocardiogram are fairly within normal limits.  She has past medical history significant for hyperlipidemia.   She presents today for follow-up. She was last seen 1 year ago and by Dr. Einar Gip.  At that visit she had elevated blood pressure and markedly elevated LDL.  She was restarted on statin and underwent a CT coronary calcium score coronary calcium score of 19 in the LAD placing her in the 74th percentile for patients of the same age, gender, and ethnicity. She has not been taking rosuvastatin as ordered.  Previously on statin she had lower extremity muscle aches and pains.  She is exercising daily and follows a healthy diet.  She does still have occasional episodes of palpitations not changed or progressively worsen. She denies chest pain, shortness of breath, leg edema, orthopnea, PND, syncope.   Past Medical History:  Diagnosis Date   Hayfever    Hypertension    Past Surgical History:  Procedure Laterality Date   ROTATOR CUFF REPAIR Left 2001   Family History  Problem Relation Age of Onset   Hypertension Mother    Thyroid disease Mother    Hypertension Father    Asthma Brother    Hypertension Brother    Hypertension Sister     Social History   Tobacco Use   Smoking status: Never   Smokeless tobacco: Never  Substance Use Topics   Alcohol use: No   Marital Status: Divorced  ROS  Review of Systems  Cardiovascular:  Positive for palpitations (stable). Negative for chest pain, claudication, dyspnea on exertion and leg swelling.  Gastrointestinal:   Negative for melena.   Objective  Blood pressure (!) 143/88, pulse 84, temperature 98 F (36.7 C), temperature source Temporal, resp. rate 16, height 5' 4" (1.626 m), weight 164 lb (74.4 kg), SpO2 97 %.     03/11/2022    1:07 PM 03/11/2022    1:01 PM 03/01/2021    2:36 PM  Vitals with BMI  Height  5' 4"   Weight  164 lbs   BMI  11.91   Systolic 478 295 621  Diastolic 88 90 98  Pulse 84 85 73     Physical Exam Cardiovascular:     Rate and Rhythm: Normal rate and regular rhythm.     Pulses: Intact distal pulses.     Heart sounds: Normal heart sounds. No murmur heard.    No gallop.     Comments: No leg edema, no JVD. Pulmonary:     Effort: Pulmonary effort is normal.     Breath sounds: Normal breath sounds.  Abdominal:     General: Bowel sounds are normal.     Palpations: Abdomen is soft.  Musculoskeletal:     Right lower leg: No edema.     Left lower leg: No edema.    Laboratory examination:   No results for input(s): "NA", "K", "CL", "CO2", "GLUCOSE", "BUN", "CREATININE", "CALCIUM", "GFRNONAA", "GFRAA" in the last 8760 hours.  CrCl cannot be calculated (Patient's most recent lab result is older  than the maximum 21 days allowed.).     Latest Ref Rng & Units 04/15/2020   10:26 AM  CMP  Glucose 70 - 99 mg/dL 119   BUN 8 - 23 mg/dL 14   Creatinine 0.44 - 1.00 mg/dL 0.81   Sodium 135 - 145 mmol/L 141   Potassium 3.5 - 5.1 mmol/L 3.7   Chloride 98 - 111 mmol/L 108   CO2 22 - 32 mmol/L 23   Calcium 8.9 - 10.3 mg/dL 8.8       Latest Ref Rng & Units 04/15/2020   10:26 AM  CBC  WBC 4.0 - 10.5 K/uL 9.1   Hemoglobin 12.0 - 15.0 g/dL 13.3   Hematocrit 36.0 - 46.0 % 42.8   Platelets 150 - 400 K/uL 281    Lipid Panel     Component Value Date/Time   CHOL 259 (H) 03/07/2022 0912   TRIG 95 03/07/2022 0912   HDL 80 03/07/2022 0912   LDLCALC 163 (H) 03/07/2022 0912   LDLDIRECT 177 (H) 03/07/2022 0912    External labs:   Medications and allergies  No Known  Allergies   Current Outpatient Medications  Medication Instructions   ascorbic acid (VITAMIN C) 500 mg, Oral, Daily   aspirin EC 81 mg, Oral, Daily, Swallow whole.   cetirizine (ZYRTEC) 10 mg, Oral, Daily   cholecalciferol (VITAMIN D3) 1,000 Units, Oral, Daily   metoprolol tartrate (LOPRESSOR) 25 mg, Oral, Daily PRN   Omega-3 Fatty Acids (FISH OIL) 1000 MG CAPS Oral   OVER THE COUNTER MEDICATION 2 tablets, Oral, Daily, MDR - Fitness tabs for women   rosuvastatin (CRESTOR) 20 mg, Daily   Radiology:   No results found.  Cardiac Studies:   Event Monitor 30 days Aug 21, 2016: 09/12/2019 for 30 days:  No symptoms reported. Sinus rhythm No atrial fibrillation events No AV block or pauses Rare nonsustained ventricular tachycardia.(personally reviewed -  AT with aberancy 4 beats  No sustained arrhythmias Baseline artifact limits interpretation  Treadmill exercise stress test 08/27/2016: Indication: Palpitations, CP Resting EKG demonstrates NSR. The patient exercised according to Bruce Protocol, Total time recorded  7:29 min achieving max heart rate of  154  which was  96% of THR for age and  9.34 METS of work.  Stress terminated due to  fatigue & THR (>85% MPHR)/MPHR met. Normal BP response. There was no ST-T changes of ischemia with exercise stress test. There were no significant arrhythmias.  Normal BP response. Rec: No e/o ischemia by GXT. Exercise tolerence is  low normal.  Continue Preventive therapy.   Echocardiogram 08/10/2016: Left ventricle cavity is normal in size. Normal global wall motion. Normal diastolic filling pattern, normal LAP. Calculated EF 68%. Trace mitral regurgitation. Trace tricuspid regurgitation. No evidence of pulmonary hypertension. Trace pulmonic regurgitation. Essentially normal echocardiogram.  CORONARY CALCIUM SCORES 03/25/2021: Left Main: 0 LAD: 19 LCx: 0 RCA: 0 Total Agatston Score: 19 MESA database percentile: 74  EKG  EKG 03/11/2022: Normal  sinus rhythm at rate of 79 bpm.  Normal axis.  Complete right bundle branch block.  Compared to previous EKG on 03/01/2021, no significant change.  Assessment     ICD-10-CM   1. Pure hypercholesterolemia  E78.00     2. Elevated blood pressure reading in office without diagnosis of hypertension  R03.0     3. PSVT (paroxysmal supraventricular tachycardia)  I47.10 EKG 12-Lead      No orders of the defined types were placed in this encounter.   There are no  discontinued medications.   Recommendations:   Pure hypercholesterolemia Labs reviewed, last LDL 177.  She has not been taking rosuvastatin.  She would like to conservatively manage LDL without medications at this time. Discussed adding flaxseed flakes to each meal, taking 1 tablespoon of Metamucil each night.  Discussed the importance of healthy diet and continued exercise. She is agreeable to recheck LDL in 3 months, if LDL remains elevated she will consider starting medication.  LDL goal <100.  Elevated blood pressure reading in office without diagnosis of hypertension Blood pressure is slightly elevated at today's visit.  She is not monitoring blood pressure at home.  She would like to avoid antihypertensive medications at this time.  She does not have a history of hypertension.  Suspect elevated blood pressure reading could be a component of whitecoat hypertension.  We will continue to monitor this at this time.  PSVT (paroxysmal supraventricular tachycardia) She does still experience occasional episodes of palpitations that have not worsened. Continues on metoprolol 25 mg as ordered.  Follow-up in 3 months or sooner if needed.   Ernst Spell AGNP-C 03/11/2022, 1:34 PM Office: [phone] Fax: [phone]

## 2022-03-15 ENCOUNTER — Ambulatory Visit: Payer: BC Managed Care – PPO

## 2023-08-30 ENCOUNTER — Emergency Department (HOSPITAL_COMMUNITY)
Admission: EM | Admit: 2023-08-30 | Discharge: 2023-08-30 | Disposition: A | Discharge status: Home / Self Care | Attending: Emergency Medicine | Admitting: Emergency Medicine

## 2023-08-30 ENCOUNTER — Other Ambulatory Visit: Payer: Self-pay

## 2023-08-30 ENCOUNTER — Encounter (HOSPITAL_COMMUNITY): Payer: Self-pay | Admitting: Pharmacy Technician

## 2023-08-30 DIAGNOSIS — I10 Essential (primary) hypertension: Secondary | ICD-10-CM | POA: Diagnosis not present

## 2023-08-30 DIAGNOSIS — Z7982 Long term (current) use of aspirin: Secondary | ICD-10-CM | POA: Insufficient documentation

## 2023-08-30 DIAGNOSIS — Z79899 Other long term (current) drug therapy: Secondary | ICD-10-CM | POA: Diagnosis not present

## 2023-08-30 DIAGNOSIS — R002 Palpitations: Secondary | ICD-10-CM | POA: Diagnosis present

## 2023-08-30 DIAGNOSIS — I471 Supraventricular tachycardia, unspecified: Secondary | ICD-10-CM | POA: Insufficient documentation

## 2023-08-30 LAB — CBC WITH DIFFERENTIAL/PLATELET
Abs Immature Granulocytes: 0.03 10*3/uL (ref 0.00–0.07)
Basophils Absolute: 0.1 10*3/uL (ref 0.0–0.1)
Basophils Relative: 1 %
Eosinophils Absolute: 0.2 10*3/uL (ref 0.0–0.5)
Eosinophils Relative: 3 %
HCT: 40.1 % (ref 36.0–46.0)
Hemoglobin: 12.8 g/dL (ref 12.0–15.0)
Immature Granulocytes: 0 %
Lymphocytes Relative: 25 %
Lymphs Abs: 2.1 10*3/uL (ref 0.7–4.0)
MCH: 28.2 pg (ref 26.0–34.0)
MCHC: 31.9 g/dL (ref 30.0–36.0)
MCV: 88.3 fL (ref 80.0–100.0)
Monocytes Absolute: 0.7 10*3/uL (ref 0.1–1.0)
Monocytes Relative: 9 %
Neutro Abs: 5.1 10*3/uL (ref 1.7–7.7)
Neutrophils Relative %: 62 %
Platelets: 274 10*3/uL (ref 150–400)
RBC: 4.54 MIL/uL (ref 3.87–5.11)
RDW: 13.3 % (ref 11.5–15.5)
WBC: 8.1 10*3/uL (ref 4.0–10.5)
nRBC: 0 % (ref 0.0–0.2)

## 2023-08-30 LAB — MAGNESIUM: Magnesium: 2 mg/dL (ref 1.7–2.4)

## 2023-08-30 LAB — BASIC METABOLIC PANEL WITH GFR
Anion gap: 9 (ref 5–15)
BUN: 14 mg/dL (ref 8–23)
CO2: 24 mmol/L (ref 22–32)
Calcium: 8.8 mg/dL — ABNORMAL LOW (ref 8.9–10.3)
Chloride: 108 mmol/L (ref 98–111)
Creatinine, Ser: 0.75 mg/dL (ref 0.44–1.00)
GFR, Estimated: >60 mL/min (ref >60)
Glucose, Bld: 104 mg/dL — ABNORMAL HIGH (ref 70–99)
Potassium: 3.8 mmol/L (ref 3.5–5.1)
Sodium: 141 mmol/L (ref 135–145)

## 2023-08-30 MED ORDER — METOPROLOL TARTRATE 25 MG PO TABS: 25.0000 mg | ORAL_TABLET | Freq: Every day | ORAL | 0 refills | Status: DC | PRN

## 2023-08-30 NOTE — ED Notes (Signed)
 CCMD notified of need to monitor pt.

## 2023-08-30 NOTE — Discharge Instructions (Signed)
 If you develop new or recurrent palpitations, chest pain, shortness of breath, or any other new/concerning symptoms then return to the ER or call 911.

## 2023-08-30 NOTE — ED Provider Notes (Signed)
  EMERGENCY DEPARTMENT AT [facility] Provider Note   CSN: [id number] Arrival date & time: 08/30/23  0932     History  Chief Complaint  Patient presents with   Palpitations    Rebecca Livingston is a 68 y.o. female.  HPI     Home Medications Prior to Admission medications   Medication Sig Start Date End Date Taking? Authorizing Provider  aspirin EC 81 MG tablet Take 1 tablet (81 mg total) by mouth daily. Swallow whole. 04/15/20   Derwood Kaplan, MD  cetirizine (ZYRTEC) 10 MG tablet Take 10 mg by mouth daily.    [provider]  cholecalciferol (VITAMIN D3) 25 MCG (1000 UNIT) tablet Take 1,000 Units by mouth daily.    [provider]  metoprolol tartrate (LOPRESSOR) 25 MG tablet Take 1 tablet (25 mg total) by mouth daily as needed (HR > 120. Do not take the medicine in any event if the SBP < 100). 08/30/23   Pricilla Loveless, MD  Omega-3 Fatty Acids (FISH OIL) 1000 MG CAPS Take by mouth.    [provider]  OVER THE COUNTER MEDICATION Take 2 tablets by mouth daily. MDR - Fitness tabs for women    [provider]  rosuvastatin (CRESTOR) 20 MG tablet Take 20 mg by mouth daily. Patient not taking: Reported on 03/11/2022 06/26/19   [provider]  vitamin C (ASCORBIC ACID) 500 MG tablet Take 500 mg by mouth daily.    [provider]      Allergies    Patient has no known allergies.    Review of Systems   Review of Systems  Physical Exam Updated Vital Signs BP (!) 128/93   Pulse 77   Temp 98 F (36.7 C)   Resp 20   SpO2 100%  Physical Exam  ED Results / Procedures / Treatments   Labs (all labs ordered are listed, but only abnormal results are displayed) Labs Reviewed  BASIC METABOLIC PANEL WITH GFR - Abnormal; Notable for the following components:      Result Value   Glucose, Bld 104 (*)    Calcium 8.8 (*)    All other components within normal limits  CBC WITH DIFFERENTIAL/PLATELET  MAGNESIUM     EKG EKG Interpretation Date/Time:  Wednesday August 30 2023 09:41:10 EDT Ventricular Rate:  81 PR Interval:  161 QRS Duration:  81 QT Interval:  367 QTC Calculation: 426 R Axis:   46  Text Interpretation: Sinus rhythm LAE, consider biatrial enlargement RSR' in V1 or V2, probably normal variant no acute ST/T changes Confirmed by Pricilla Loveless [phone]) on 08/30/2023 10:37:55 AM  Radiology No results found.  Procedures Procedures    Medications Ordered in ED Medications - No data to display  ED Course/ Medical Decision Making/ A&P                                 Medical Decision Making Amount and/or Complexity of Data Reviewed External Data Reviewed: notes.    Details: Most recent cardiology note Labs: ordered.    Details: Normal potassium and magnesium.  Normal hemoglobin. ECG/medicine tests: ordered and independent interpretation performed.    Details: Sinus rhythm  Risk Prescription drug management.   Patient's ECGs from EMS shows SVT.  She is currently asymptomatic.  Mildly hypertensive but otherwise feels well.  No leg swelling or unilateral leg swelling.  No anginal symptoms so I do not think  troponins are necessary.  She feels fine now.  Will refill her metoprolol and refer her back to cardiology.  Otherwise stable for discharge home with return precautions.        Final Clinical Impression(s) / ED Diagnoses Final diagnoses:  SVT (supraventricular tachycardia) (HCC)    Rx / DC Orders ED Discharge Orders          Ordered    metoprolol tartrate (LOPRESSOR) 25 MG tablet  Daily PRN        08/30/23 0948    Ambulatory referral to Cardiology       Comments: If you have not heard from the Cardiology office within the next 72 hours please call [phone].   08/30/23 1038              Pricilla Loveless, MD 08/30/23 1047

## 2023-08-30 NOTE — ED Triage Notes (Signed)
 Pt here from UC with c/o heart palpitations ,heart rate in the 150's at UC , pt received 6 adenosine and rate came down , pt has prn lopressor to take at home but it was expired

## 2023-08-30 NOTE — ED Triage Notes (Signed)
 Pt bib ems with complaints of palpitations since yesterday. HR 160's. Given 6mg  adenosine en route with improvement in HR.

## 2023-09-20 IMAGING — CT CT CARDIAC CORONARY ARTERY CALCIUM SCORE
3 series · 14 of 20 positions shown, 16 images · non-contrast
Comparison: None.

CLINICAL DATA: 65-year-old black female with hyperlipidemia.

EXAM:
CT CARDIAC CORONARY ARTERY CALCIUM SCORE
TECHNIQUE: Non-contrast imaging through the heart was performed using
prospective ECG gating. Image post processing was performed on an
independent workstation, allowing for quantitative analysis of the
heart and coronary arteries. Note that this exam targets the heart
and the chest was not imaged in its entirety.

[Series 2: calcium scoring 2.00 qr36 bestdiast 66% hrt calciu · axial · 0.34mm/px · z∈[+1590,+1674]mm · 4 of 70 slices shown]
[im 14/70  vessel]
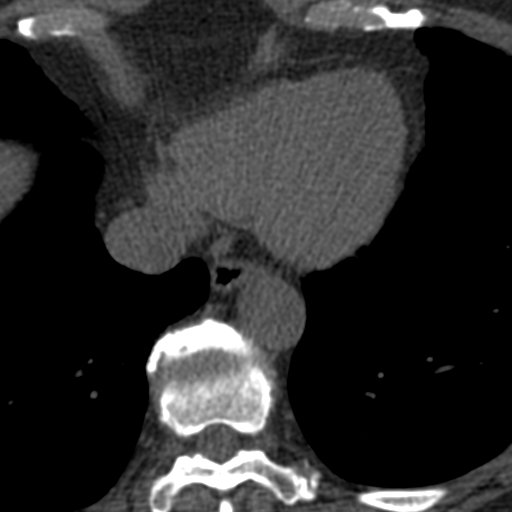
[im 28/70  vessel]
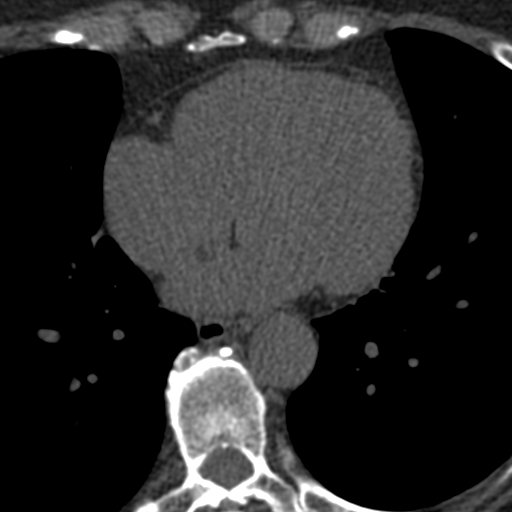
[im 42/70  vessel]
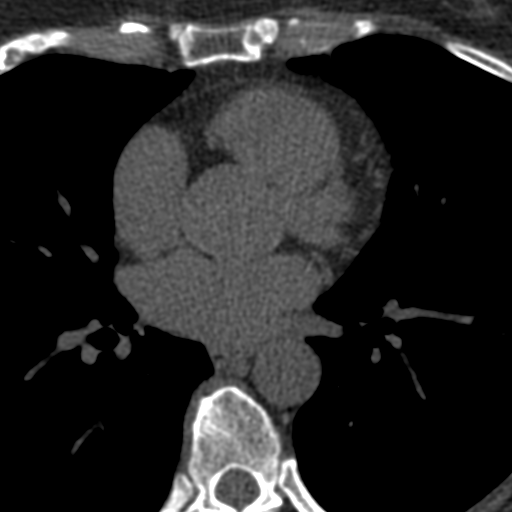
[im 56/70  vessel]
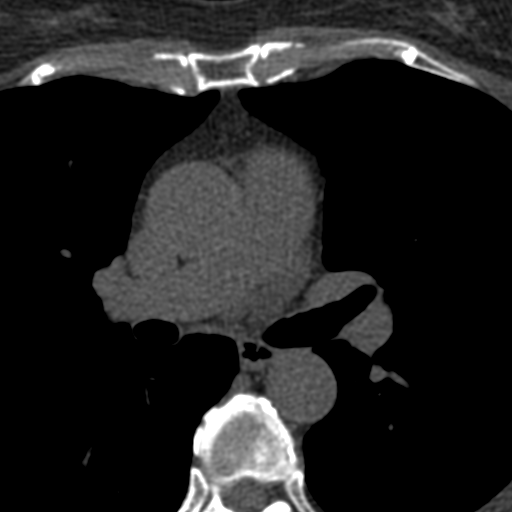

[Series 3: calcium scoring 2.00 br40 bestdiast 66% axial · axial · 0.53mm/px · z∈[+1586,+1678]mm · 5 of 70 slices shown, 7 images]
[im 12/70  vessel]
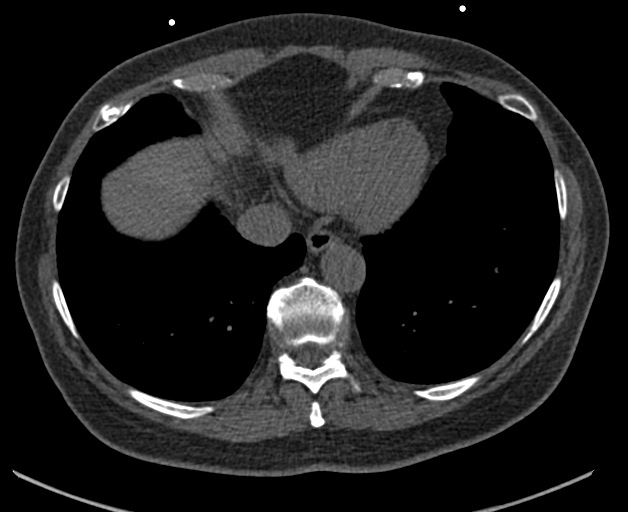
[im 12/70  lung]
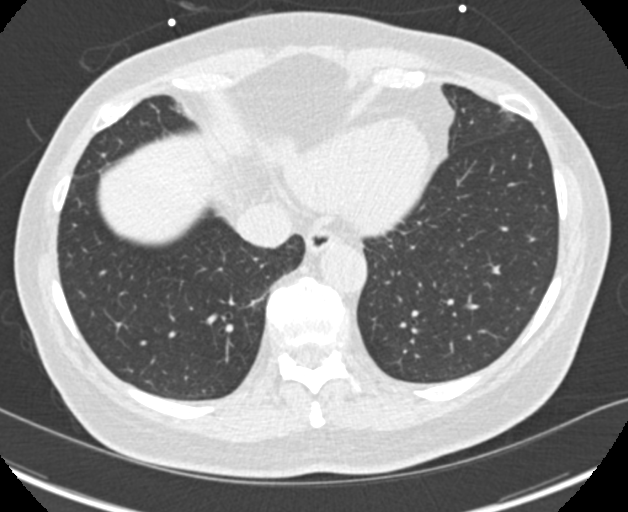
[im 24/70  vessel]
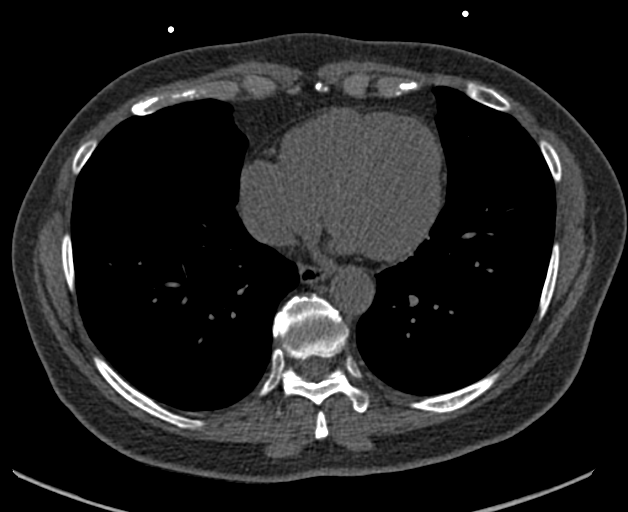
[im 35/70  vessel]
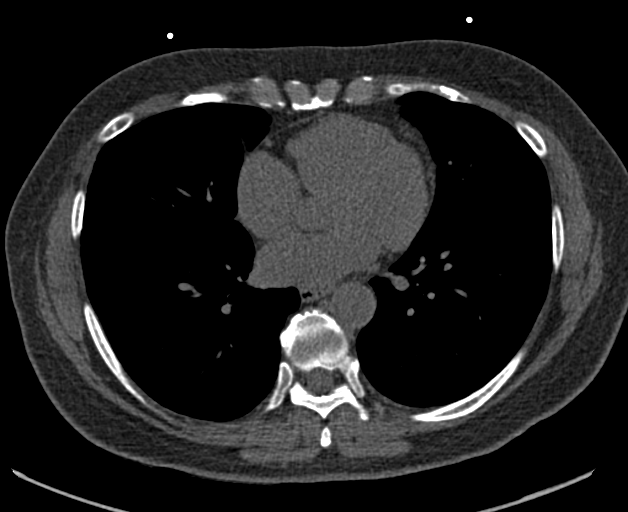
[im 47/70  vessel]
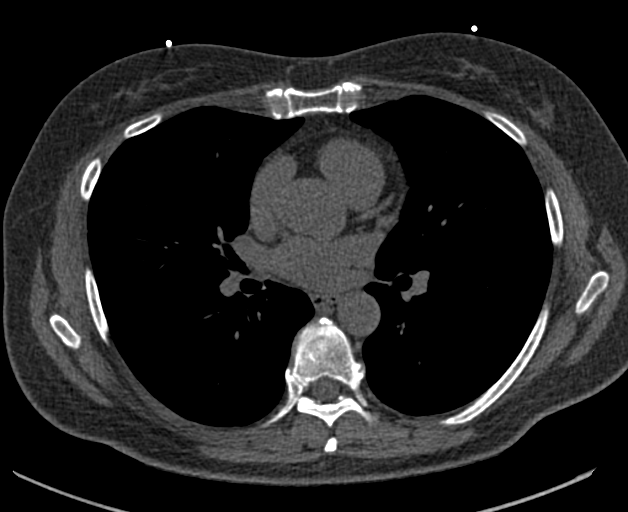
[im 58/70  vessel]
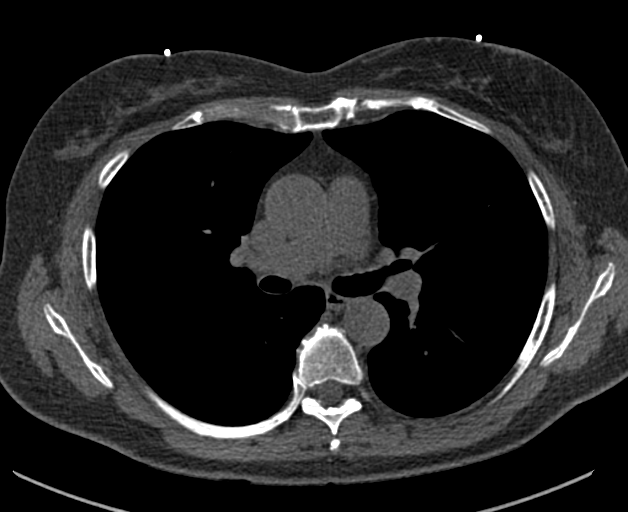
[im 58/70  lung]
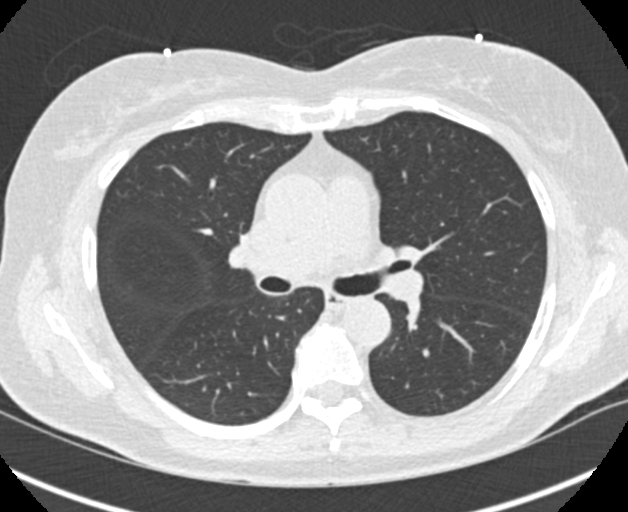

[Series 9: calcium scoring 2.00 br60 bestdiast 66% lungs · axial · 0.51mm/px · z∈[+1586,+1678]mm · 5 of 70 slices shown]
[im 12/70  vessel]
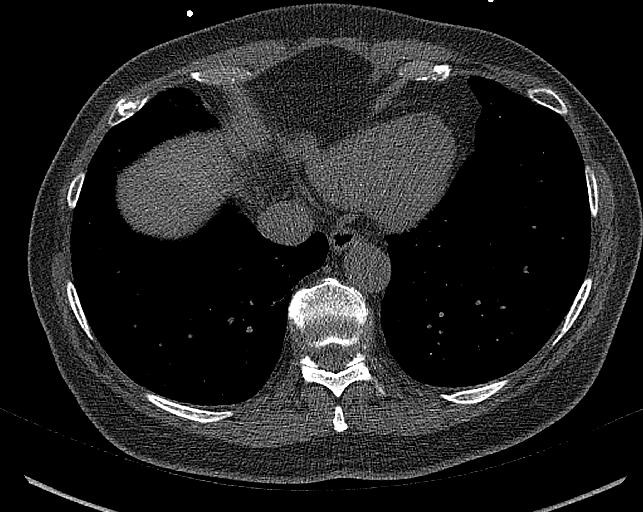
[im 24/70  vessel]
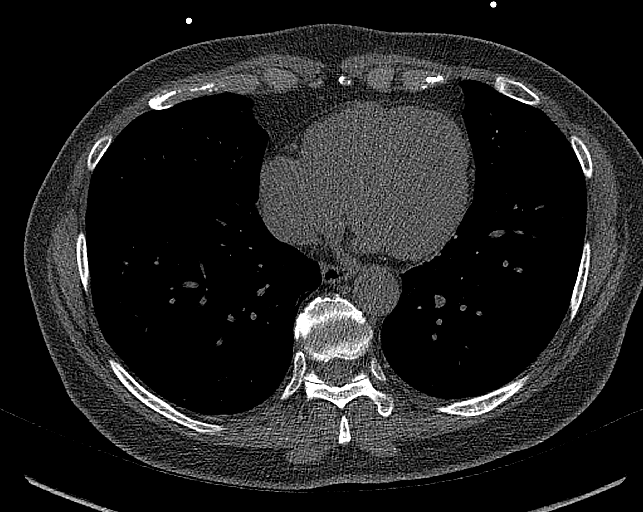
[im 35/70  vessel]
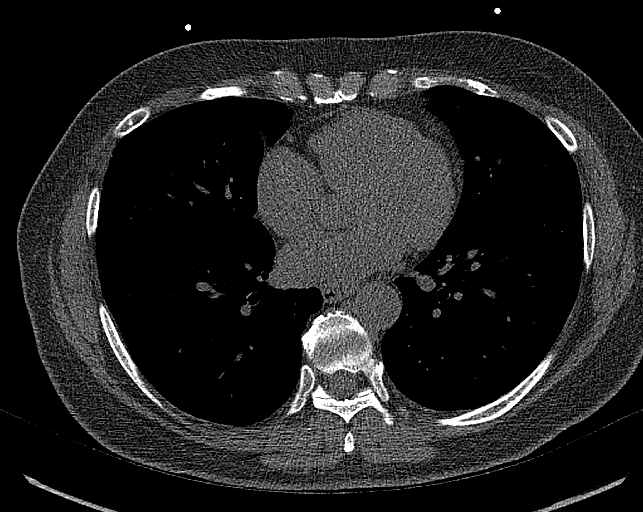
[im 47/70  vessel]
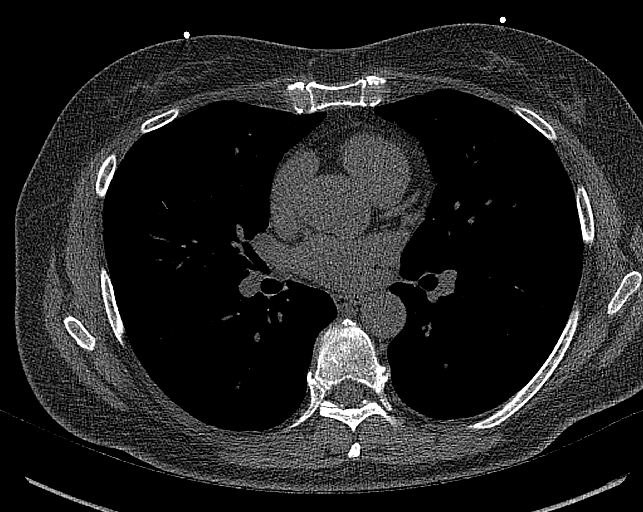
[im 58/70  vessel]
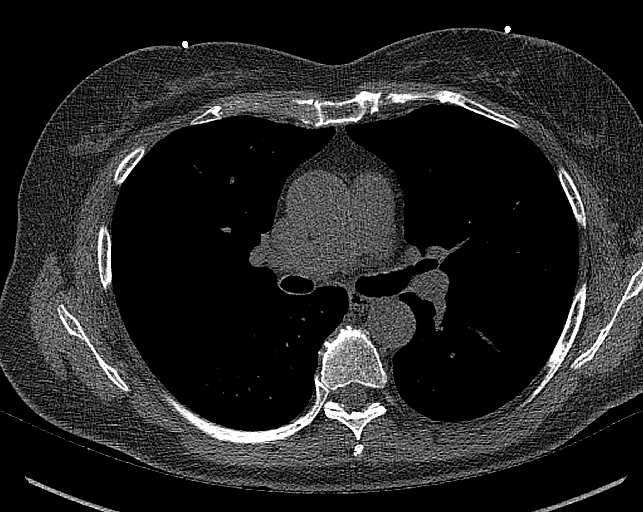

[14 of 20 positions shown; findings below may reference images not displayed]

FINDINGS: CORONARY CALCIUM SCORES:

Left Main: 0

LAD: 19

LCx: 0

RCA: 0

Total Agatston Score: 19

[HOSPITAL] percentile: 74

AORTA MEASUREMENTS:

Ascending Aorta: 32 mm

Descending Aorta: 26 mm

OTHER FINDINGS:

Heart size is normal. Visualized mediastinal structures are normal.
Images of the upper abdomen are unremarkable. 3 mm peripheral nodule
in the right lower lobe on sequence 9, image 66. Tiny calcified
granuloma in the superior segment of the right lower lobe on image
9. No large areas of airspace disease or consolidation in the
visualized lungs. No acute bone abnormality.
IMPRESSION: 1. Coronary calcium score is 19 and this is at percentile 74 for
patients of the same age, gender and ethnicity.
2. Indeterminate 3 mm nodule at the right lung base. No follow-up
needed if patient is low-risk. Non-contrast chest CT can be
considered in 12 months if patient is high-risk. This recommendation
follows the consensus statement: Guidelines for Management of
Incidental Pulmonary Nodules Detected on CT Images: From the

## 2023-09-25 ENCOUNTER — Emergency Department (HOSPITAL_COMMUNITY)

## 2023-09-25 ENCOUNTER — Emergency Department (HOSPITAL_COMMUNITY)
Admission: EM | Admit: 2023-09-25 | Discharge: 2023-09-25 | Disposition: A | Discharge status: Home / Self Care | Attending: Emergency Medicine | Admitting: Emergency Medicine

## 2023-09-25 ENCOUNTER — Encounter (HOSPITAL_COMMUNITY): Payer: Self-pay | Admitting: Emergency Medicine

## 2023-09-25 DIAGNOSIS — R519 Headache, unspecified: Secondary | ICD-10-CM | POA: Diagnosis not present

## 2023-09-25 DIAGNOSIS — I10 Essential (primary) hypertension: Secondary | ICD-10-CM | POA: Diagnosis not present

## 2023-09-25 DIAGNOSIS — Z7982 Long term (current) use of aspirin: Secondary | ICD-10-CM | POA: Diagnosis not present

## 2023-09-25 DIAGNOSIS — R030 Elevated blood-pressure reading, without diagnosis of hypertension: Secondary | ICD-10-CM | POA: Diagnosis present

## 2023-09-25 LAB — CBC
HCT: 39.6 % (ref 36.0–46.0)
Hemoglobin: 12.6 g/dL (ref 12.0–15.0)
MCH: 28.3 pg (ref 26.0–34.0)
MCHC: 31.8 g/dL (ref 30.0–36.0)
MCV: 88.8 fL (ref 80.0–100.0)
Platelets: 244 10*3/uL (ref 150–400)
RBC: 4.46 MIL/uL (ref 3.87–5.11)
RDW: 13.3 % (ref 11.5–15.5)
WBC: 8.6 10*3/uL (ref 4.0–10.5)
nRBC: 0 % (ref 0.0–0.2)

## 2023-09-25 LAB — COMPREHENSIVE METABOLIC PANEL WITH GFR
ALT: 15 U/L (ref 0–44)
AST: 20 U/L (ref 15–41)
Albumin: 3.4 g/dL — ABNORMAL LOW (ref 3.5–5.0)
Alkaline Phosphatase: 57 U/L (ref 38–126)
Anion gap: 8 (ref 5–15)
BUN: 9 mg/dL (ref 8–23)
CO2: 25 mmol/L (ref 22–32)
Calcium: 9.1 mg/dL (ref 8.9–10.3)
Chloride: 108 mmol/L (ref 98–111)
Creatinine, Ser: 0.67 mg/dL (ref 0.44–1.00)
GFR, Estimated: >60 mL/min (ref >60)
Glucose, Bld: 120 mg/dL — ABNORMAL HIGH (ref 70–99)
Potassium: 3.7 mmol/L (ref 3.5–5.1)
Sodium: 141 mmol/L (ref 135–145)
Total Bilirubin: 0.5 mg/dL (ref 0.0–1.2)
Total Protein: 6.4 g/dL — ABNORMAL LOW (ref 6.5–8.1)

## 2023-09-25 LAB — TROPONIN I (HIGH SENSITIVITY)
Troponin I (High Sensitivity): 6 ng/L (ref <18)
Troponin I (High Sensitivity): 6 ng/L (ref <18)

## 2023-09-25 MED ORDER — METOCLOPRAMIDE HCL 5 MG/ML IJ SOLN
10.0000 mg | Freq: Once | INTRAMUSCULAR | Status: AC
  Administered 2023-09-25: 10 mg via INTRAVENOUS
  Filled 2023-09-25: qty 2

## 2023-09-25 MED ORDER — KETOROLAC TROMETHAMINE 30 MG/ML IJ SOLN
30.0000 mg | Freq: Once | INTRAMUSCULAR | Status: AC
  Administered 2023-09-25: 30 mg via INTRAVENOUS
  Filled 2023-09-25: qty 1

## 2023-09-25 MED ORDER — CLONIDINE HCL 0.1 MG PO TABS: 0.1000 mg | ORAL_TABLET | Freq: Three times a day (TID) | ORAL | 0 refills | Status: DC | PRN

## 2023-09-25 MED ORDER — DIPHENHYDRAMINE HCL 50 MG/ML IJ SOLN
12.5000 mg | Freq: Once | INTRAMUSCULAR | Status: AC
  Administered 2023-09-25: 12.5 mg via INTRAVENOUS
  Filled 2023-09-25: qty 1

## 2023-09-25 NOTE — Discharge Instructions (Addendum)
 Please follow-up with your primary care doctor this week for your headache and your blood pressure recheck.  Keep a daily diary of your blood pressures checking once or twice a day.  Remember, if your numbers appear high, try to relax is much as possible in a calm environment for at least 10 minutes, and then recheck your blood pressure afterwards.  I prescribed clonidine as a "rescue medicine" that you can take once every 8 hours, as needed, for very high blood pressure.  Consider using this if your systolic blood pressure top number is over 200 mmhg on repeat checks.  We gave you IV medications for possible migraine headache in the ER.  Please review the information below regarding headache workups in the ER.  We did not see any emergency cause of your headache at this time, but you may need further follow-up from your primary care clinic or specialist in the future.  *  You are having a headache. This condition is very commonly seen in the Emergency Department. No specific cause was found today for your headache. It may have been a migraine or another cause of headache. Stress, anxiety, fatigue, and depression are common triggers for headaches.   Fortunately, your headache today does not appear to be life-threatening or require hospitalization at this time. This may change in the future. Sometimes headaches can appear benign (not harmful), but then more serious symptoms can develop, which should prompt an immediate re-evaluation by your doctor or the emergency department.  It is very important that you speak to your primary care doctor in the next 48 hours, to re-evaluate your symptoms, and to determine whether you need any further diagnostic testing or treatment.   SEEK MEDICAL ATTENTION IF: - you develop reaction or side effects to the medications prescribed.  - your headache is not improving within 48 hours - you have multiple episodes of vomiting or can't drink fluids - you have a change in  pattern from your usual headache (eg. New location, new intensity, new symptoms, etc)  RETURN IMMEDIATELY IF you develop a sudden, severe worsening of your headache or confusion, become poorly responsive, feel like passing out, develop a fever above 100.18F, have a change in speech, vision, or swallowing, develop new weakness, numbness, tingling, or incoordination, or have a seizure.   You are having a headache. This condition is very commonly seen in the Emergency Department. No specific cause was found today for your headache. It may have been a migraine or another cause of headache. Stress, anxiety, fatigue, and depression are common triggers for headaches.   Fortunately, your headache today does not appear to be life-threatening or require hospitalization at this time. This may change in the future. Sometimes headaches can appear benign (not harmful), but then more serious symptoms can develop, which should prompt an immediate re-evaluation by your doctor or the emergency department.  It is very important that you speak to your primary care doctor in the next 48 hours, to re-evaluate your symptoms, and to determine whether you need any further diagnostic testing or treatment.   SEEK MEDICAL ATTENTION IF: - you develop reaction or side effects to the medications prescribed.  - your headache is not improving within 48 hours - you have multiple episodes of vomiting or can't drink fluids - you have a change in pattern from your usual headache (eg. New location, new intensity, new symptoms, etc)  RETURN IMMEDIATELY IF you develop a sudden, severe worsening of your headache or confusion, become  poorly responsive, feel like passing out, develop a fever above 100.71F, have a change in speech, vision, or swallowing, develop new weakness, numbness, tingling, or incoordination, or have a seizure.

## 2023-09-25 NOTE — ED Notes (Signed)
 Patient Alert and oriented to baseline. Stable and ambulatory to baseline. Patient verbalized understanding of the discharge instructions.  Patient belongings were taken by the patient.

## 2023-09-25 NOTE — ED Provider Notes (Signed)
 [place] EMERGENCY DEPARTMENT AT [facility] Provider Note   CSN: [id number] Arrival date & time: 09/25/23  0702     History  Chief Complaint  Patient presents with   Hypertension    Rebecca Livingston is a 68 y.o. female w/ hx of HLD presenting to ED with high BP and headache.  Patient reports that she had a mild frontal and left temporal headache yesterday while she was at church.  This morning around 3 AM she woke up with an ongoing headache in bed, checked her blood pressure SBP > 200 mmhg.  Typically checks BP and SBP is 120-130 mmhg, well controlled without medications.  She did perhaps eat more salt than normal over the past 2 days, Saturday and Sunday with seafood.  She reports that her headache persisted into the morning, which is atypical for her, and her blood pressure does remain high, which prompted her visit to the ER.  She reports she did have a "flutter or weird feeling in my chest" yesterday.  Denies shortness of breath, fevers, chills.  Does report she has had "a lingering cough for a few weeks".  Denies smoking history.  Outpt records reviewed - hx of coronary CT with cardiology in 2022, per Dr Maida Sciara note pt has Calcium score 17 in LAD, single vessel disease, 74 percentile for age  HPI     Home Medications Prior to Admission medications   Medication Sig Start Date End Date Taking? Authorizing Provider  cloNIDine (CATAPRES) 0.1 MG tablet Take 1 tablet (0.1 mg total) by mouth 3 (three) times daily as needed for up to 21 doses. For systolic blood pressure (top number) over 200 mmhg on 2 repeat checks 09/25/23  Yes Tayvian Holycross, Janalyn Me, MD  aspirin  EC 81 MG tablet Take 1 tablet (81 mg total) by mouth daily. Swallow whole. 04/15/20   Deatra Face, MD  cetirizine (ZYRTEC) 10 MG tablet Take 10 mg by mouth daily.    [provider]  cholecalciferol (VITAMIN D3) 25 MCG (1000 UNIT) tablet Take 1,000 Units by mouth daily.    [provider]   metoprolol  tartrate (LOPRESSOR ) 25 MG tablet Take 1 tablet (25 mg total) by mouth daily as needed (HR > 120. Do not take the medicine in any event if the SBP < 100). 08/30/23   Jerilynn Montenegro, MD  Omega-3 Fatty Acids (FISH OIL) 1000 MG CAPS Take by mouth.    [provider]  OVER THE COUNTER MEDICATION Take 2 tablets by mouth daily. MDR - Fitness tabs for women    [provider]  rosuvastatin (CRESTOR) 20 MG tablet Take 20 mg by mouth daily. Patient not taking: Reported on 03/11/2022 06/26/19   [provider]  vitamin C (ASCORBIC ACID) 500 MG tablet Take 500 mg by mouth daily.    [provider]      Allergies    Patient has no known allergies.    Review of Systems   Review of Systems  Physical Exam Updated Vital Signs BP (!) 158/81   Pulse 64   Temp 98 F (36.7 C) (Oral)   Resp 12   SpO2 100%  Physical Exam Constitutional:      General: She is not in acute distress. HENT:     Head: Normocephalic and atraumatic.  Eyes:     Conjunctiva/sclera: Conjunctivae normal.     Pupils: Pupils are equal, round, and reactive to light.  Cardiovascular:     Rate and Rhythm: Normal rate and  regular rhythm.  Pulmonary:     Effort: Pulmonary effort is normal. No respiratory distress.  Abdominal:     General: There is no distension.     Tenderness: There is no abdominal tenderness.  Musculoskeletal:     Cervical back: Normal range of motion and neck supple. No rigidity or tenderness.  Skin:    General: Skin is warm and dry.  Neurological:     General: No focal deficit present.     Mental Status: She is alert and oriented to person, place, and time. Mental status is at baseline.  Psychiatric:        Mood and Affect: Mood normal.        Behavior: Behavior normal.     ED Results / Procedures / Treatments   Labs (all labs ordered are listed, but only abnormal results are displayed) Labs Reviewed  COMPREHENSIVE METABOLIC PANEL WITH GFR - Abnormal;  Notable for the following components:      Result Value   Glucose, Bld 120 (*)    Total Protein 6.4 (*)    Albumin 3.4 (*)    All other components within normal limits  CBC  TROPONIN I (HIGH SENSITIVITY)  TROPONIN I (HIGH SENSITIVITY)    EKG None  Radiology CT Head Wo Contrast Result Date: 09/25/2023 CLINICAL DATA:  68 year old female with headache and hypertension. EXAM: CT HEAD WITHOUT CONTRAST TECHNIQUE: Contiguous axial images were obtained from the base of the skull through the vertex without intravenous contrast. RADIATION DOSE REDUCTION: This exam was performed according to the departmental dose-optimization program which includes automated exposure control, adjustment of the mA and/or kV according to patient size and/or use of iterative reconstruction technique. COMPARISON:  None Available. FINDINGS: Brain: Cerebral volume is within normal limits for age. No midline shift, ventriculomegaly, mass effect, evidence of mass lesion, intracranial hemorrhage or evidence of cortically based acute infarction. Gray-white matter differentiation is within normal limits throughout the brain. Vascular: Calcified atherosclerosis at the skull base. No suspicious intracranial vascular hyperdensity. Skull: Intact, negative. Sinuses/Orbits: Tympanic cavities, Visualized paranasal sinuses and mastoids are well aerated. Other: Visualized orbits and scalp soft tissues are within normal limits. IMPRESSION: Normal for age noncontrast Head CT. Electronically Signed   By: Marlise Simpers M.D.   On: 09/25/2023 08:44   DG Chest 2 View Result Date: 09/25/2023 CLINICAL DATA:  Cough.  Chest pressure. EXAM: CHEST - 2 VIEW COMPARISON:  Coronary calcium CT 03/25/2021 FINDINGS: Cardiac silhouette and mediastinal contours are within normal limits. The lungs are clear. No pleural effusion or pneumothorax. Mild multilevel degenerative changes thoracic spine. Mild dextrocurvature of the lower thoracic spine. IMPRESSION: No active  cardiopulmonary disease. Electronically Signed   By: Bertina Broccoli M.D.   On: 09/25/2023 08:31    Procedures Procedures    Medications Ordered in ED Medications  metoCLOPramide (REGLAN) injection 10 mg (10 mg Intravenous Given 09/25/23 1300)  ketorolac (TORADOL) 30 MG/ML injection 30 mg (30 mg Intravenous Given 09/25/23 1300)  diphenhydrAMINE (BENADRYL) injection 12.5 mg (12.5 mg Intravenous Given 09/25/23 1300)    ED Course/ Medical Decision Making/ A&P Clinical Course as of 09/25/23 1439  Mon Sep 25, 2023  0850 BP 150/100 mmhg [MT]  1258 Patient reassessed and headache has improved but is still present.  She did not initially want migraine medicines but is willing to take them now.  Blood pressure 150's systolic -not significantly elevated and more likely to be a response of your headache than a cause of the headache.  Her  workup is otherwise been unremarkable.  She will get the migraine medications and will be discharged home.  I did discuss with her rescue medication for high blood pressure which she can monitor and use at home until she sees her PCP.  She is comfortable with this plan.  Return precautions provided.  Okay for discharge [MT]    Clinical Course User Index [MT] Christyan Reger, Janalyn Me, MD                                 Medical Decision Making Amount and/or Complexity of Data Reviewed Labs: ordered. Radiology: ordered.  Risk Prescription drug management.   This patient presents to the Emergency Department with complaint of headache and high blood pressure.  This involves an extensive number of treatment options, and is a complaint that carries with it a high risk of complications and morbidity.  The differential diagnosis for headache includes tension type headache vs occipital headache vs migraine vs sinusitis vs intracranial bleed vs hypertensive urgency or emergency versus other  There is also reported some vague chest discomfort yesterday, now resolved, as well as  cough.  I ordered, reviewed, and interpreted labs, notable for no emergent findings I ordered medication for headache and/or nausea I ordered imaging studies which included dg chest, CT head  I independently visualized and interpreted imaging which showed no emergent findings and the monitor tracing which showed sinus rhythm External records reviewed - cardiology evaluation as noted above, hx of SVT I personally reviewed the patients ECG which showed sinus rhythm with no acute ischemic findings  After the interventions stated above, I reevaluated the patient and found that the patient remained clinically stable.  Based on the patient's clinical exam, vital signs, risk factors, and ED testing, I felt that the patient's overall risk of life-threatening emergency such as ICH, meningitis, intracranial mass or tumor was quite low.  I suspect this clinical presentation is most consistent with nonspecific headache and high blood pressure, but explained to the patient that this evaluation was not a definitive diagnostic workup.  I discussed outpatient follow up with primary care provider, and provided specialist office number on the patient's discharge paper if a referral was deemed necessary.  I discussed return precautions with the patient. I felt the patient was clinically stable for discharge.         Final Clinical Impression(s) / ED Diagnoses Final diagnoses:  Hypertension, unspecified type  Nonintractable headache, unspecified chronicity pattern, unspecified headache type    Rx / DC Orders ED Discharge Orders          Ordered    cloNIDine (CATAPRES) 0.1 MG tablet  3 times daily PRN        09/25/23 1248              Arvilla Birmingham, MD 09/25/23 1439

## 2023-09-25 NOTE — ED Triage Notes (Signed)
 Pt here form home with c/o headache and htn that started yesterday at church , no sob or chest pain

## 2023-09-25 NOTE — ED Notes (Signed)
 Pt decided to hold off on IV medications at this time.

## 2023-11-20 NOTE — Progress Notes (Signed)
 " Cardiology Office Note:  .   Date:  11/21/2023  ID:  Rebecca Livingston, DOB [birth year], MRN [id number] PCP: Sun, Vyvyan, MD  [facility] Providers Cardiologist:  Gordy Bergamo, MD   History of Present Illness: .   Rebecca Livingston is a 68 y.o. African-American female patient with palpitations suggestive of SVT but no documented SVT episodes last evaluated for SVT like symptoms in 2016, event monitor and stress test echocardiogram fairly normal limits at that time.  Past medical history significant for hypertension, hypercholesterolemia and coronary calcium score in the 74th percentile in 2022.  Last seen 2 years ago.  Patient presented to the emergency room f on 08/30/2023 with palpitations, found to have SVT and on route to ED, received 6 mg of IV adenosine with successful termination of the SVT on EKG, started on beta-blocker and discharged home from ED.  She again presented on 09/25/2023 with elevated blood pressure and started on clonidine  0.1 mg tablet 3 times daily as needed  Discussed the use of AI scribe software for clinical note transcription with the patient, who gave verbal consent to proceed.  History of Present Illness Rebecca Livingston is a 68 year old female with supraventricular tachycardia who presents with recent episodes of SVT and high blood pressure.  She experienced two episodes of SVT, with the first in April, marked by an increased heart rate unresponsive to the Valsalva maneuver. Her metoprolol  was expired at that time. The episode resolved with IV adenosine administered by EMS. No further SVT episodes have occurred since. She now experiences high blood pressure, a new issue for her. She is not taking metoprolol  daily and had an expired supply during her SVT episode. Home blood pressure monitoring shows elevated readings, and she associates headaches with these elevations.  She has high cholesterol but is not taking Crestor due to previous leg weakness with  atorvastatin. She maintains an active lifestyle with regular exercise and has lost weight.  Labs    Lab Results  Component Value Date   NA 141 09/25/2023   K 3.7 09/25/2023   CO2 25 09/25/2023   GLUCOSE 120 (H) 09/25/2023   BUN 9 09/25/2023   CREATININE 0.67 09/25/2023   CALCIUM 9.1 09/25/2023   GFRNONAA >60 09/25/2023      Latest Ref Rng & Units 09/25/2023    7:17 AM 08/30/2023    9:49 AM 04/15/2020   10:26 AM  BMP  Glucose 70 - 99 mg/dL 879  895  880   BUN 8 - 23 mg/dL 9  14  14    Creatinine 0.44 - 1.00 mg/dL 9.32  9.24  9.18   Sodium 135 - 145 mmol/L 141  141  141   Potassium 3.5 - 5.1 mmol/L 3.7  3.8  3.7   Chloride 98 - 111 mmol/L 108  108  108   CO2 22 - 32 mmol/L 25  24  23    Calcium 8.9 - 10.3 mg/dL 9.1  8.8  8.8       Latest Ref Rng & Units 09/25/2023    7:17 AM 08/30/2023    9:49 AM 04/15/2020   10:26 AM  CBC  WBC 4.0 - 10.5 K/uL 8.6  8.1  9.1   Hemoglobin 12.0 - 15.0 g/dL 87.3  87.1  86.6   Hematocrit 36.0 - 46.0 % 39.6  40.1  42.8   Platelets 150 - 400 K/uL 244  274  281    External Labs:  Labs on [facility] 07/14/2023:  Total  cholesterol 225, triglycerides 89, HDL 64, LDL 146.  ROS  Review of Systems  Cardiovascular:  Positive for palpitations. Negative for chest pain, dyspnea on exertion, leg swelling and near-syncope.  Neurological:  Positive for headaches (occasional).    Physical Exam:   VS:  BP (!) 144/84 (BP Location: Left Arm, Patient Position: Sitting, Cuff Size: Normal)   Pulse 65   Resp 16   Ht 5' 4 (1.626 m)   Wt 162 lb (73.5 kg)   SpO2 100%   BMI 27.81 kg/m        11/21/2023    8:49 AM 09/25/2023   12:45 PM 09/25/2023   12:15 PM  Vitals with BMI  Height 5' 4    Weight 162 lbs    BMI 27.79    Systolic 144 158 847  Diastolic 84 81 92  Pulse 65 64 63    Wt Readings from Last 3 Encounters:  11/21/23 162 lb (73.5 kg)  03/11/22 164 lb (74.4 kg)  03/01/21 167 lb (75.8 kg)    Physical Exam Neck:     Vascular: No carotid bruit or JVD.    Cardiovascular:     Rate and Rhythm: Normal rate and regular rhythm.     Pulses: Intact distal pulses.     Heart sounds: Normal heart sounds. No murmur heard.    No gallop.  Pulmonary:     Effort: Pulmonary effort is normal.     Breath sounds: Normal breath sounds.  Abdominal:     General: Bowel sounds are normal.     Palpations: Abdomen is soft.   Musculoskeletal:     Right lower leg: No edema.     Left lower leg: No edema.    Studies Reviewed: SABRA     EKG:       EKG 03/11/2022: Normal sinus rhythm at rate of 79 bpm.  Normal axis.  Incomplete right bundle branch block.       Medications ordered    Meds ordered this encounter  Medications   metoprolol  tartrate (LOPRESSOR ) 25 MG tablet    Sig: Take 1 tablet (25 mg total) by mouth 2 (two) times daily.    Dispense:  180 tablet    Refill:  1   ezetimibe  (ZETIA ) 10 MG tablet    Sig: Take 1 tablet (10 mg total) by mouth daily.    Dispense:  90 tablet    Refill:  1   simvastatin  (ZOCOR ) 20 MG tablet    Sig: Take 1 tablet (20 mg total) by mouth at bedtime.    Dispense:  90 tablet    Refill:  1     ASSESSMENT AND PLAN: .      ICD-10-CM   1. PSVT (paroxysmal supraventricular tachycardia) (HCC)  I47.10 metoprolol  tartrate (LOPRESSOR ) 25 MG tablet    2. Primary hypertension  I10 metoprolol  tartrate (LOPRESSOR ) 25 MG tablet    3. Pure hypercholesterolemia  E78.00 ezetimibe  (ZETIA ) 10 MG tablet    simvastatin  (ZOCOR ) 20 MG tablet    Lipid Profile      Assessment and Plan Assessment & Plan Supraventricular tachycardia (SVT) Recurrent episodes of SVT, with a recent episode requiring emergency intervention with IV adenosine to convert to sinus rhythm. No current episodes reported. Discussed potential for catheter ablation procedure to eliminate SVT, with a success rate of 95% and low procedural risk. She is not committed to the procedure but will consider it after reviewing provided information. - Provide information  on catheter ablation for  SVT, patient wants to think about this. - Continue metoprolol  25 mg twice daily for SVT and hypertension - Send MyChart message if decision is made to proceed with SVT ablation  Hypertension Recent onset of hypertension, with current blood pressure readings higher than desired. Discussed the importance of controlling blood pressure to prevent strokes and myocardial infarction. Current management includes lifestyle modifications and medication adjustments. - Continue metoprolol  25 mg twice daily - Monitor blood pressure at home - Target blood pressure of 130/80 mmHg or less - Consider adding losartan if blood pressure remains elevated  Hyperlipidemia Cholesterol levels remain high, with previous non-compliance to Crestor due to perceived side effects. Discussed alternative treatment options with simvastatin  and ezetimibe , which are well-tolerated and effective in lowering cholesterol levels. Emphasized the importance of managing cholesterol to prevent cardiovascular events. - Discontinue Crestor - Start simvastatin  20 mg and ezetimibe  10 mg daily - Check cholesterol levels in two months  OV in 6 months   Signed,  Gordy Bergamo, MD, [facility] 11/21/2023, 6:32 PM [facility] [address] [place], [place] [address] Phone: [phone]. Fax:  [phone]  "

## 2023-11-21 ENCOUNTER — Encounter: Payer: Self-pay | Admitting: Cardiology

## 2023-11-21 ENCOUNTER — Other Ambulatory Visit: Payer: Self-pay | Admitting: *Deleted

## 2023-11-21 ENCOUNTER — Ambulatory Visit: Attending: Cardiology | Admitting: Cardiology

## 2023-11-21 VITALS — BP 144/84 | HR 65 | Resp 16 | Ht 64.0 in | Wt 162.0 lb

## 2023-11-21 DIAGNOSIS — I10 Essential (primary) hypertension: Secondary | ICD-10-CM | POA: Diagnosis not present

## 2023-11-21 DIAGNOSIS — I471 Supraventricular tachycardia, unspecified: Secondary | ICD-10-CM

## 2023-11-21 DIAGNOSIS — E7800 Pure hypercholesterolemia, unspecified: Secondary | ICD-10-CM | POA: Diagnosis not present

## 2023-11-21 MED ORDER — EZETIMIBE 10 MG PO TABS: 10.0000 mg | ORAL_TABLET | Freq: Every day | ORAL | 1 refills | Status: DC

## 2023-11-21 MED ORDER — METOPROLOL TARTRATE 25 MG PO TABS: 25.0000 mg | ORAL_TABLET | Freq: Two times a day (BID) | ORAL | 1 refills | Status: DC

## 2023-11-21 MED ORDER — SIMVASTATIN 20 MG PO TABS: 20.0000 mg | ORAL_TABLET | Freq: Every day | ORAL | 1 refills | Status: DC

## 2023-11-21 NOTE — Patient Instructions (Signed)
 Medication Instructions:  Your physician has recommended you make the following change in your medication: Stop Rosuvastatin. Start Simvastatin 20 mg by mouth daily  Start Ezetimibe 10 mg by mouth daily  Change metoprolol  to metoprolol  tartrate 25 mg by mouth twice daily   *If you need a refill on your cardiac medications before your next appointment, please call your pharmacy*  Lab Work: Have fasting lab work done in about 2 months.  Lipid profile.  Can be done at any [facility].  There is a [employer] on the first floor of our building If you have labs (blood work) drawn today and your tests are completely normal, you will receive your results only by: [employer] (if you have MyChart) OR A paper copy in the mail If you have any lab test that is abnormal or we need to change your treatment, we will call you to review the results.  Testing/Procedures: none  Follow-Up: At [facility], you and your health needs are our priority.  As part of our continuing mission to provide you with exceptional heart care, our providers are all part of one team.  This team includes your primary Cardiologist (physician) and Advanced Practice Providers or APPs (Physician Assistants and Nurse Practitioners) who all work together to provide you with the care you need, when you need it.  Your next appointment:   6 month(s)  Provider:       We recommend signing up for the patient portal called MyChart.  Sign up information is provided on this After Visit Summary.  MyChart is used to connect with patients for Virtual Visits (Telemedicine).  Patients are able to view lab/test results, encounter notes, upcoming appointments, etc.  Non-urgent messages can be sent to your provider as well.   To learn more about what you can do with MyChart, go to [email or web address].   Other Instructions

## 2023-12-05 ENCOUNTER — Encounter: Payer: Self-pay | Admitting: Cardiology

## 2023-12-05 DIAGNOSIS — E7800 Pure hypercholesterolemia, unspecified: Secondary | ICD-10-CM

## 2023-12-05 DIAGNOSIS — I10 Essential (primary) hypertension: Secondary | ICD-10-CM

## 2023-12-06 NOTE — Telephone Encounter (Signed)
 Could discontinue telmisartan and switch her to olmesartan  HCT 40/25 mg daily and check BMP in 2 weeks to 3 weeks.  Send for 30 tablets with 2 refills.

## 2023-12-07 ENCOUNTER — Other Ambulatory Visit: Payer: Self-pay

## 2023-12-07 MED ORDER — OLMESARTAN MEDOXOMIL-HCTZ 40-25 MG PO TABS: 1.0000 | ORAL_TABLET | Freq: Every day | ORAL | 3 refills | Status: DC

## 2023-12-07 NOTE — Progress Notes (Signed)
 Prescriptions for Olmesartan -HCT 40/25 mg once daily sent to preferred pharmacy.

## 2023-12-08 ENCOUNTER — Other Ambulatory Visit: Payer: Self-pay | Admitting: *Deleted

## 2023-12-08 DIAGNOSIS — I10 Essential (primary) hypertension: Secondary | ICD-10-CM

## 2024-01-15 ENCOUNTER — Encounter: Payer: Self-pay | Admitting: Cardiology

## 2024-01-16 ENCOUNTER — Encounter: Payer: Self-pay | Admitting: Cardiology

## 2024-01-17 ENCOUNTER — Ambulatory Visit: Payer: Self-pay | Admitting: Cardiology

## 2024-01-17 LAB — BASIC METABOLIC PANEL WITH GFR
BUN/Creatinine Ratio: 12 (ref 12–28)
BUN: 8 mg/dL (ref 8–27)
CO2: 22 mmol/L (ref 20–29)
Calcium: 9.5 mg/dL (ref 8.7–10.3)
Chloride: 107 mmol/L — ABNORMAL HIGH (ref 96–106)
Creatinine, Ser: 0.68 mg/dL (ref 0.57–1.00)
Glucose: 88 mg/dL (ref 70–99)
Potassium: 4.9 mmol/L (ref 3.5–5.2)
Sodium: 143 mmol/L (ref 134–144)
eGFR: 95 mL/min/1.73 (ref >59)

## 2024-01-17 LAB — LIPID PANEL
Chol/HDL Ratio: 2.2 ratio (ref 0.0–4.4)
Cholesterol, Total: 177 mg/dL (ref 100–199)
HDL: 82 mg/dL (ref >39)
LDL Chol Calc (NIH): 83 mg/dL (ref 0–99)
Triglycerides: 61 mg/dL (ref 0–149)
VLDL Cholesterol Cal: 12 mg/dL (ref 5–40)

## 2024-01-17 NOTE — Progress Notes (Signed)
 Lipids now normal on Simva 20 and zetia  10. Prior lab  Total cholesterol 225, triglycerides 89, HDL 64, LDL 146.  Continue the same and f/u with PCP

## 2024-02-29 ENCOUNTER — Encounter: Payer: Self-pay | Admitting: Cardiology

## 2024-02-29 NOTE — Telephone Encounter (Signed)
 You can cut Olmesartan  40/25 mg tab to 1/2 daily

## 2024-03-04 ENCOUNTER — Other Ambulatory Visit: Payer: Self-pay

## 2024-03-04 DIAGNOSIS — E7800 Pure hypercholesterolemia, unspecified: Secondary | ICD-10-CM

## 2024-03-07 MED ORDER — OLMESARTAN MEDOXOMIL-HCTZ 40-25 MG PO TABS: 1.0000 | ORAL_TABLET | Freq: Every day | ORAL | 2 refills | Status: AC

## 2024-03-07 MED ORDER — EZETIMIBE 10 MG PO TABS: 10.0000 mg | ORAL_TABLET | Freq: Every day | ORAL | 2 refills | Status: AC

## 2024-03-07 MED ORDER — SIMVASTATIN 20 MG PO TABS: 20.0000 mg | ORAL_TABLET | Freq: Every day | ORAL | 2 refills | Status: AC

## 2024-03-13 ENCOUNTER — Encounter: Payer: Self-pay | Admitting: Neurology

## 2024-03-13 ENCOUNTER — Ambulatory Visit: Payer: Self-pay | Admitting: Neurology

## 2024-03-13 VITALS — BP 98/62 | HR 84 | Ht 66.0 in | Wt 157.0 lb

## 2024-03-13 DIAGNOSIS — G500 Trigeminal neuralgia: Secondary | ICD-10-CM

## 2024-03-13 DIAGNOSIS — G4489 Other headache syndrome: Secondary | ICD-10-CM

## 2024-03-13 NOTE — Progress Notes (Signed)
 [facility]  PATIENT: Rebecca Livingston DOB: [birth year]  REFERRING DOCTOR OR PCP:  Vyvyan Sun, MD SOURCE: Patient, notes from primary care, imaging results, CT scan personally reviewed.  _________________________________   HISTORICAL  CHIEF COMPLAINT:  Chief Complaint  Patient presents with   New Patient (Initial Visit)    Pt in room 10. Alone. Paper referral for Chronic headaches. Headaches are more so on left side, mild to sharp sensation.     HISTORY OF PRESENT ILLNESS:  I had the pleasure of seeing patient, Rebecca Livingston, at [facility] for neurologic consultation regarding her headaches.  She is a 68 yo woman with hypertension, hyperlipidemia and supraventricular tachycardia who had the onset of headaches aroud 2023.   At that time the intensity was mild, left > right.    Pain has since intensified but is still intermittent.   She now has a shooting pain at times (left greater than right) and also has pain with a pressure quality, (right greater than left) .SABRA   The more intense left sided shooting headaches are intermittent and occur a few times a week for 20 seconds.   The most intense shooting pain just lasts 1-2 seconds.     In May 2025, she was experiencing palpitations and had changes in blood pressure medication.  She went to the ED 09/25/2023. Headaches were more troublesome that day and preceding day.    She had a head CT scan.  By my review, the scan of the head 09/25/2023 shows a partially empty sella turcica and otherwise normal for age.    She denies neck pain.   She denies a visual aura or visual disturbance.   She denies nausea, vomiting.  Sometimes will have mild photophobia.     She denies weakness or numbness.   Gait and balance are fine.     No change in bladder function.  She has SVT and sees cardiology.   This was rare and nonsustained on cardiac monitoring in 2021   REVIEW OF SYSTEMS: Constitutional: No fevers,  chills, sweats, or change in appetite Eyes: No visual changes, double vision, eye pain Ear, nose and throat: No hearing loss, ear pain, nasal congestion, sore throat Cardiovascular: No chest pain,.  She has a supraventricular tachycardia Respiratory:  No shortness of breath at rest or with exertion.   No wheezes GastrointestinaI: No nausea, vomiting, diarrhea, abdominal pain, fecal incontinence Genitourinary:  No dysuria, urinary retention or frequency.  No nocturia. Musculoskeletal:  No neck pain, back pain Integumentary: No rash, pruritus, skin lesions Neurological: as above Psychiatric: No depression at this time.  No anxiety Endocrine: No palpitations, diaphoresis, change in appetite, change in weigh or increased thirst Hematologic/Lymphatic:  No anemia, purpura, petechiae. Allergic/Immunologic: No itchy/runny eyes, nasal congestion, recent allergic reactions, rashes  ALLERGIES: No Known Allergies  HOME MEDICATIONS:  Current Outpatient Medications:    cetirizine (ZYRTEC) 10 MG tablet, Take 10 mg by mouth daily., Disp: , Rfl:    cholecalciferol (VITAMIN D3) 25 MCG (1000 UNIT) tablet, Take 1,000 Units by mouth daily., Disp: , Rfl:    clobetasol ointment (TEMOVATE) 0.05 %, Apply 1 Application topically as needed (rash)., Disp: , Rfl:    cyanocobalamin 100 MCG tablet, Take 100 mcg by mouth daily., Disp: , Rfl:    ezetimibe  (ZETIA ) 10 MG tablet, Take 1 tablet (10 mg total) by mouth daily., Disp: 90 tablet, Rfl: 2   olmesartan -hydrochlorothiazide (BENICAR  HCT) 40-25 MG tablet, Take 1 tablet by mouth daily. (Patient taking  differently: Take 1 tablet by mouth daily. Takes 1/2 tablet.), Disp: 90 tablet, Rfl: 2   Omega-3 Fatty Acids (FISH OIL) 1000 MG CAPS, Take by mouth., Disp: , Rfl:    OVER THE COUNTER MEDICATION, Take 2 tablets by mouth daily. MDR - Fitness tabs for women, Disp: , Rfl:    simvastatin  (ZOCOR ) 20 MG tablet, Take 1 tablet (20 mg total) by mouth at bedtime., Disp: 90 tablet,  Rfl: 2   vitamin C (ASCORBIC ACID) 500 MG tablet, Take 500 mg by mouth daily., Disp: , Rfl:   PAST MEDICAL HISTORY: Past Medical History:  Diagnosis Date   Hayfever    Hypertension     PAST SURGICAL HISTORY: Past Surgical History:  Procedure Laterality Date   ROTATOR CUFF REPAIR Left 2001    FAMILY HISTORY: Family History  Problem Relation Age of Onset   Hypertension Mother    Thyroid  disease Mother    Hypertension Father    Hypertension Sister    Asthma Brother    Hypertension Brother     SOCIAL HISTORY: Social History   Socioeconomic History   Marital status: Divorced    Spouse name: Not on file   Number of children: 2   Years of education: Not on file   Highest education level: Not on file  Occupational History   Not on file  Tobacco Use   Smoking status: Never   Smokeless tobacco: Never  Vaping Use   Vaping status: Never Used  Substance and Sexual Activity   Alcohol use: No   Drug use: No   Sexual activity: Yes    Birth control/protection: None  Other Topics Concern   Not on file  Social History Narrative   Not on file   Social Drivers of Health   Financial Resource Strain: Not on file  Food Insecurity: Not on file  Transportation Needs: Not on file  Physical Activity: Not on file  Stress: Not on file  Social Connections: Not on file  Intimate Partner Violence: Not on file       PHYSICAL EXAM  Vitals:   03/13/24 1259  BP: 98/62  Pulse: 84  SpO2: 96%  Weight: 157 lb (71.2 kg)  Height: 5' 6 (1.676 m)    Body mass index is 25.34 kg/m.    General: The patient is well-developed and well-nourished and in no acute distress  HEENT:  Head is Coulter/AT.  Sclera are anicteric.  Funduscopic examination showed normal optic disks.  Retinal vessels were normal.  Neck: No carotid bruits are noted.  The neck is nontender.  Cardiovascular: The heart has a regular rate and rhythm with a normal S1 and S2. There were no murmurs, gallops or rubs.     Skin: Extremities are without rash or  edema.  Musculoskeletal:  Back is nontender  Neurologic Exam  Mental status: The patient is alert and oriented x 3 at the time of the examination. The patient has apparent normal recent and remote memory, with an apparently normal attention span and concentration ability.   Speech is normal.  Cranial nerves: Extraocular movements are full.  Visual acuity and color vision was symmetric.  There is good facial sensation to soft touch bilaterally.Facial strength is normal.  Trapezius and sternocleidomastoid strength is normal. No dysarthria is noted.  The tongue is midline, and the patient has symmetric elevation of the soft palate. No obvious hearing deficits are noted.  Motor:  Muscle bulk is normal.   Tone is normal. Strength is  5 / 5 in all 4 extremities.   Sensory: Sensory testing is intact to pinprick, soft touch and vibration sensation in all 4 extremities.  Coordination: Cerebellar testing reveals good finger-nose-finger and heel-to-shin bilaterally.  Gait and station: Station is normal.   Gait is normal. Tandem gait was mildly wide but normal for age. Romberg is negative.   Reflexes: Deep tendon reflexes are symmetric and normal bilaterally.       DIAGNOSTIC DATA (LABS, IMAGING, TESTING) - I reviewed patient records, labs, notes, testing and imaging myself where available.  Lab Results  Component Value Date   WBC 8.6 09/25/2023   HGB 12.6 09/25/2023   HCT 39.6 09/25/2023   MCV 88.8 09/25/2023   PLT 244 09/25/2023      Component Value Date/Time   NA 143 01/16/2024 1033   K 4.9 01/16/2024 1033   CL 107 (H) 01/16/2024 1033   CO2 22 01/16/2024 1033   GLUCOSE 88 01/16/2024 1033   GLUCOSE 120 (H) 09/25/2023 0717   BUN 8 01/16/2024 1033   CREATININE 0.68 01/16/2024 1033   CALCIUM 9.5 01/16/2024 1033   PROT 6.4 (L) 09/25/2023 0717   ALBUMIN 3.4 (L) 09/25/2023 0717   AST 20 09/25/2023 0717   ALT 15 09/25/2023 0717   ALKPHOS 57  09/25/2023 0717   BILITOT 0.5 09/25/2023 0717   GFRNONAA >60 09/25/2023 0717   Lab Results  Component Value Date   CHOL 177 01/16/2024   HDL 82 01/16/2024   LDLCALC 83 01/16/2024   LDLDIRECT 177 (H) 03/07/2022   TRIG 61 01/16/2024   CHOLHDL 2.2 01/16/2024        ASSESSMENT AND PLAN  Other headache syndrome  Trigeminal neuralgia of left side of face  In summary, Rebecca Livingston is a 68 year old woman with an increase in headache frequency and intensity this year.  She has 2 different types of headaches.  On the left, she has shoulder shooting type pain that could be due to trigeminal neuralgia.  She also has a more pressure type headache on the right that is milder in intensity but longer duration.  Although the frequency of the headaches has increased she feels they are tolerable at the current level most of the time.  Her examination today was normal.  The CT scan showed no acute findings though she does have a partially empty sella.  Since there was no papilledema on funduscopic examination, this is likely an incidental finding unrelated to CSF pressure.  If the headaches, especially the left neuralgic type headaches, worsening I would recommend that we check an MRI of the brain with and without contrast with thin cuts through the trigeminal nerves to determine if there is a vascular loop affecting the dorsal root entry zone or the transitional segment of the nerve.  If that type of pain worsens would also consider adding a medication like oxcarbazepine.  She will return to see me as needed if she has new or worsening symptoms.  Thank you for asking me to see Rebecca Livingston.  Please let me know if I can be of further assistance with her or other patients in the future.   Anupama Piehl A. Vear, MD, [facility] 03/13/2024, 1:18 PM Certified in Neurology, Clinical Neurophysiology, Sleep Medicine and Neuroimaging  [facility] [address], [address] [place], [place] [address] [phone]
